# Patient Record
Sex: Male | Born: 1980 | Race: White | Hispanic: No | Marital: Married | Smoking: Former smoker
Health system: Southern US, Community
[De-identification: ages and names within clinical notes are randomized; demographics above are authoritative.]

## PROBLEM LIST (undated history)

## (undated) DIAGNOSIS — F419 Anxiety disorder, unspecified: Secondary | ICD-10-CM

## (undated) HISTORY — DX: Anxiety disorder, unspecified: F41.9

## (undated) HISTORY — PX: WISDOM TOOTH EXTRACTION: SHX21

---

## 2004-02-14 ENCOUNTER — Ambulatory Visit (HOSPITAL_COMMUNITY): Admission: RE | Admit: 2004-02-14 | Discharge: 2004-02-14 | Payer: Self-pay | Admitting: Specialist

## 2005-12-25 IMAGING — CR DG ORBITS FOR FOREIGN BODY
2 series · 2 of 2 positions shown · non-contrast
Comparison: none

CLINICAL DATA: Back pain, pre MRI, works with metal.  
 ORBITS FOR FOREIGN BODY:
 No evidence of a metallic foreign body.

[view not recorded (1 of 2)]
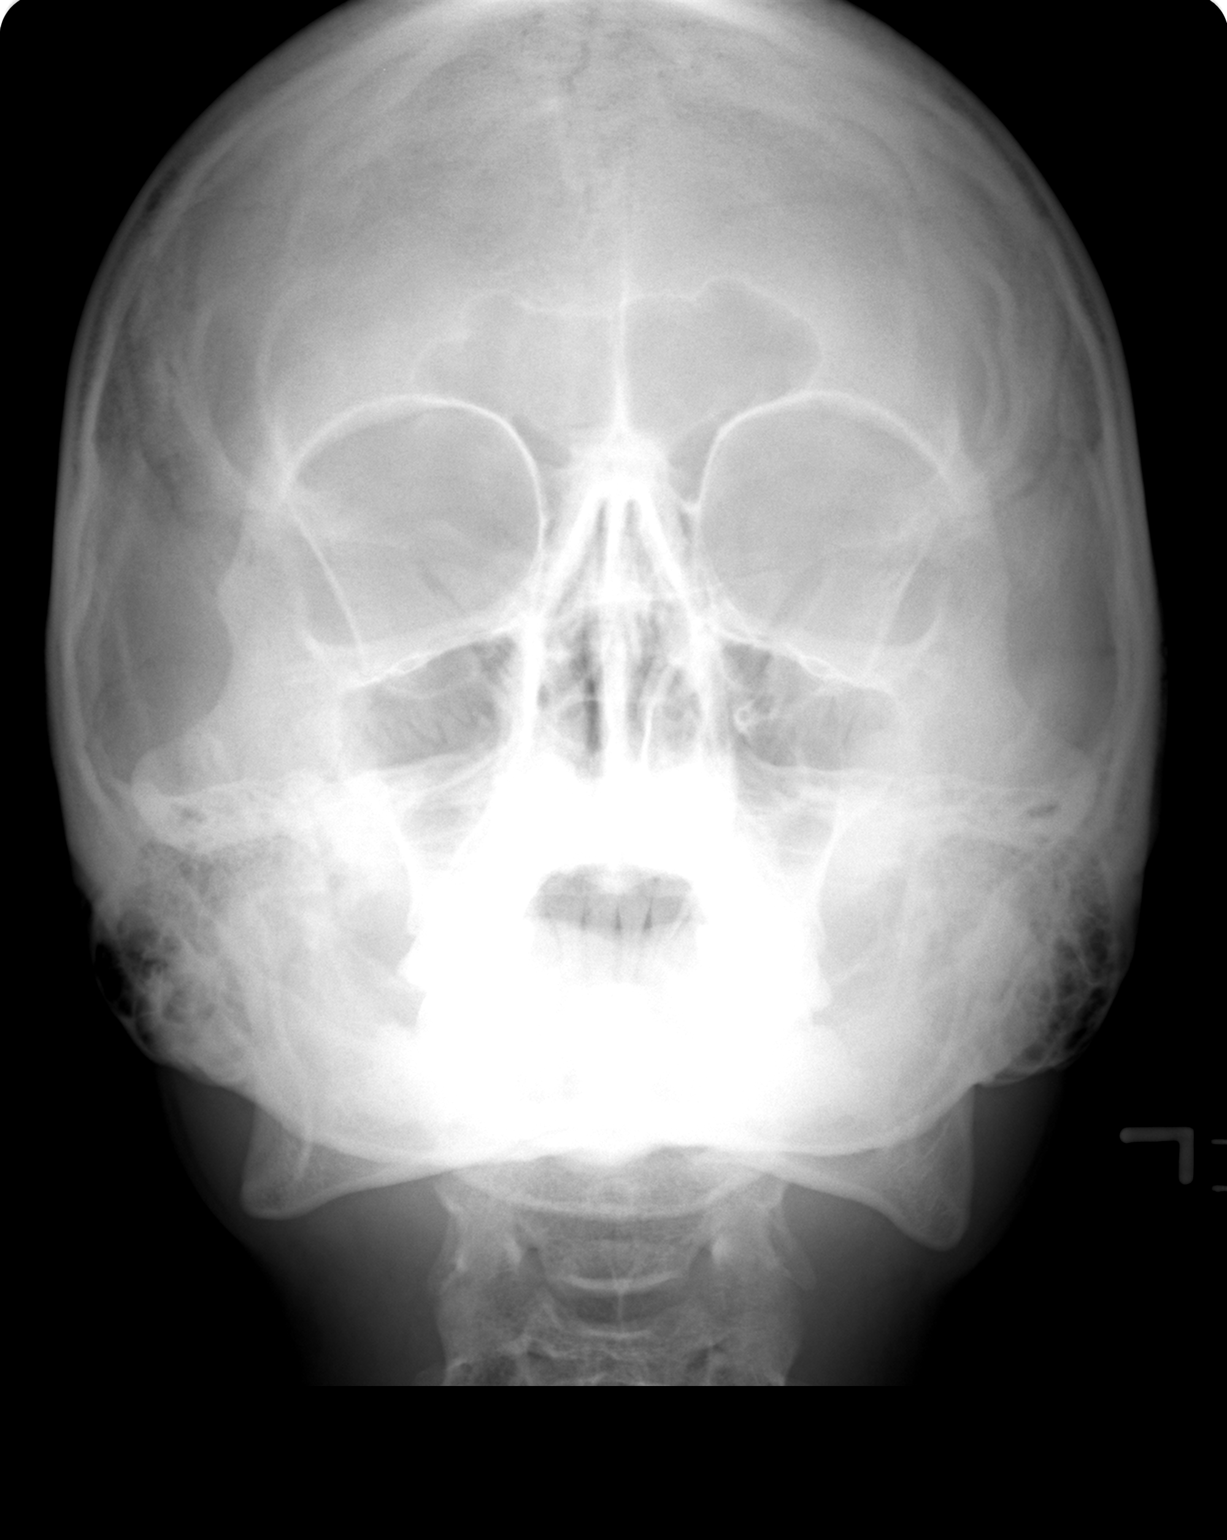

[view not recorded (2 of 2)]
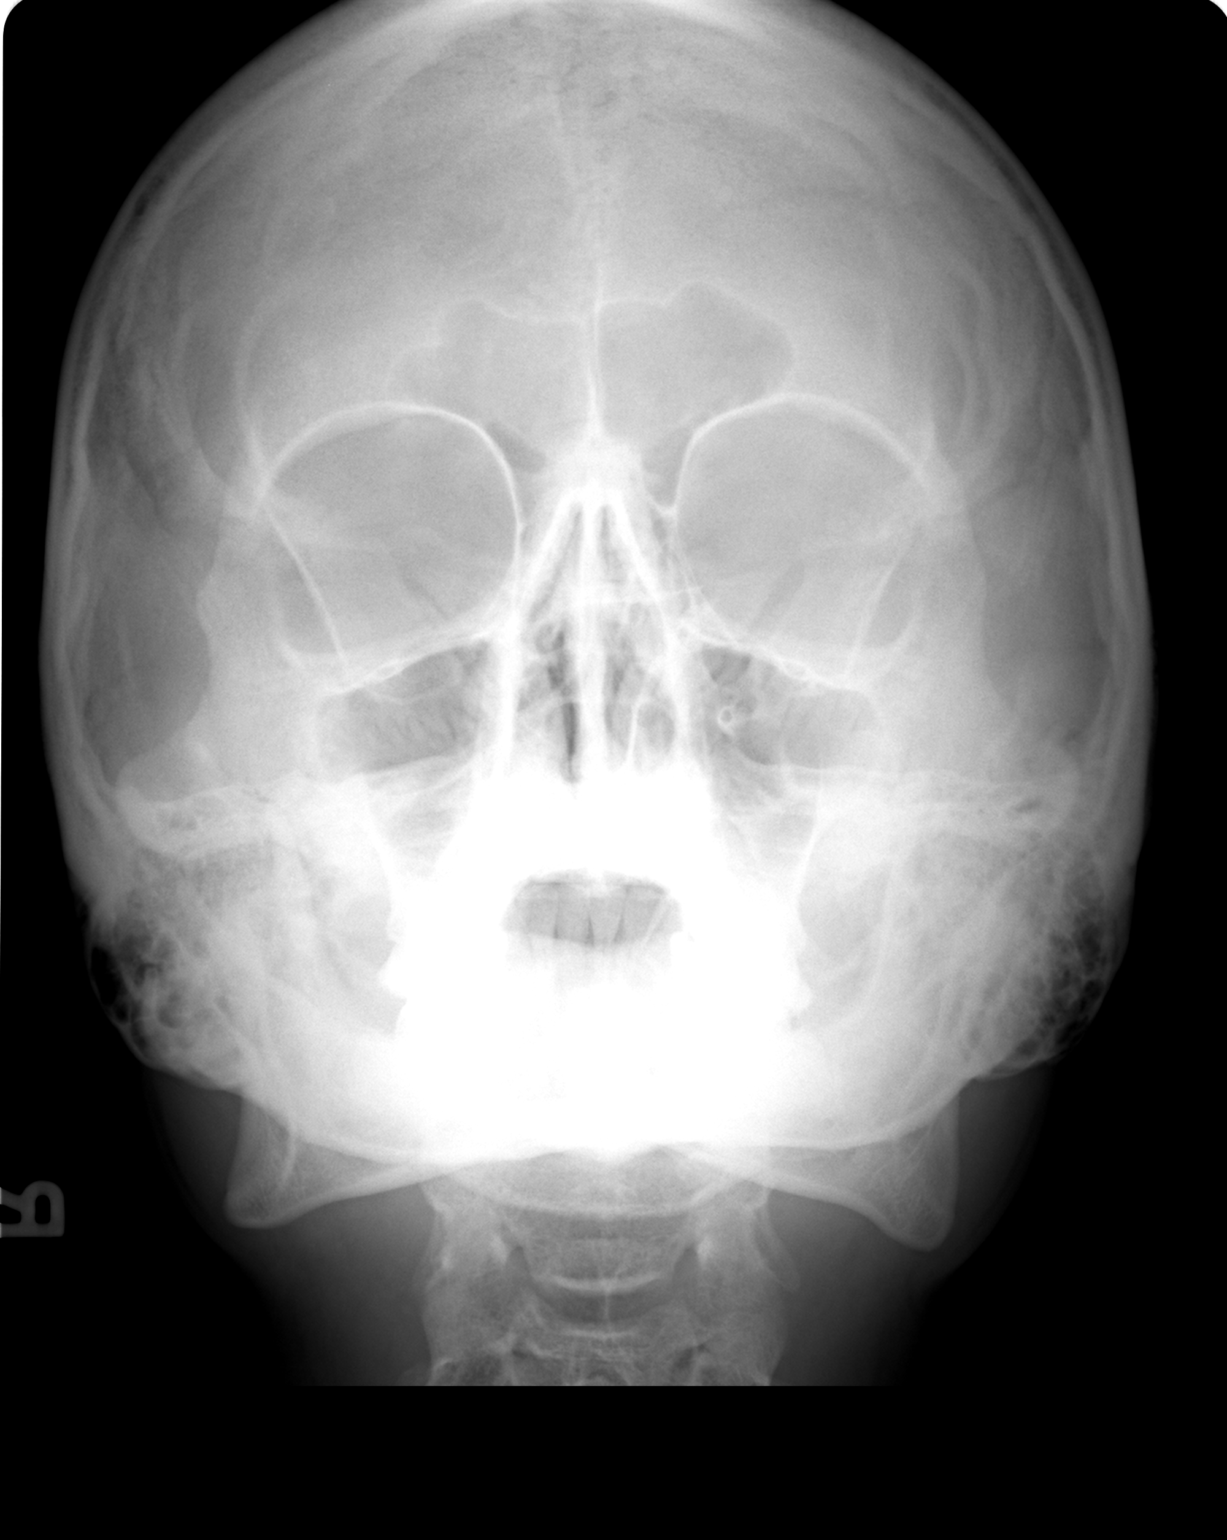

[2 of 2 positions shown; findings below may reference images not displayed]

IMPRESSION: No opaque foreign body.

## 2007-03-31 ENCOUNTER — Emergency Department (HOSPITAL_COMMUNITY): Admission: EM | Admit: 2007-03-31 | Discharge: 2007-03-31 | Payer: Self-pay | Admitting: Emergency Medicine

## 2010-09-26 ENCOUNTER — Encounter: Payer: Self-pay | Admitting: Internal Medicine

## 2010-09-26 ENCOUNTER — Other Ambulatory Visit: Payer: Self-pay | Admitting: Internal Medicine

## 2010-09-26 ENCOUNTER — Ambulatory Visit (INDEPENDENT_AMBULATORY_CARE_PROVIDER_SITE_OTHER): Payer: BC Managed Care – PPO | Admitting: Internal Medicine

## 2010-09-26 VITALS — BP 126/66 | HR 60 | Temp 98.8°F | Resp 14 | Wt 198.0 lb

## 2010-09-26 DIAGNOSIS — R202 Paresthesia of skin: Secondary | ICD-10-CM

## 2010-09-26 DIAGNOSIS — R209 Unspecified disturbances of skin sensation: Secondary | ICD-10-CM

## 2010-09-26 DIAGNOSIS — R6882 Decreased libido: Secondary | ICD-10-CM

## 2010-09-26 DIAGNOSIS — R5383 Other fatigue: Secondary | ICD-10-CM

## 2010-09-26 DIAGNOSIS — Z0000 Encounter for general adult medical examination without abnormal findings: Secondary | ICD-10-CM

## 2010-09-26 DIAGNOSIS — R5381 Other malaise: Secondary | ICD-10-CM

## 2010-09-26 DIAGNOSIS — Z23 Encounter for immunization: Secondary | ICD-10-CM

## 2010-09-26 MED ORDER — TETANUS-DIPHTH-ACELL PERTUSSIS 5-2.5-18.5 LF-MCG/0.5 IM SUSP
0.5000 mL | Freq: Once | INTRAMUSCULAR | Status: AC
  Administered 2010-09-26: 0.5 mL via INTRAMUSCULAR

## 2010-09-26 NOTE — Progress Notes (Signed)
Subjective:    Patient ID: James Orr, male    DOB: Aug 11, 1980, 30 y.o.   MRN: [id number]  HPI  James Orr)  is here for a physical; he has no acute issues except inguinal fungal infection for which he saw Dr Margo Aye, [facility]. He is concerned about his brother's multiple health issues.      Review of Systems Patient reports no significant  vision/ hearing changes,anorexia, weight change, fever ,adenopathy, persistant / recurrent hoarseness, swallowing issues, chest pain,palpitations, edema,persistant / recurrent cough, hemoptysis, dyspnea(rest, exertional, paroxysmal nocturnal), gastrointestinal  bleeding (melena, rectal bleeding), abdominal pain, excessive heart burn, GU symptoms( dysuria, hematuria, pyuria, voiding/incontinence  issues) syncope ( only remotely x2), focal weakness, memory loss, skin/hair/nail changes,depression, anxiety, abnormal bruising/bleeding, musculoskeletal symptoms/signs. He has persistent fatigue despite adequate rest,but he denies  muscle weakness. He has decreased libido; his brother had Testosterone deficiency as did MGF & M uncles.  Some anterior chest wall pain on R with turning in car. Intermittent numbness & tingling in forearms & hands with repeititive  hand use     Objective:   Physical Exam Gen.: Healthy and well-nourished in appearance. Alert, appropriate and cooperative throughout exam. Head: Normocephalic without obvious abnormalities;  no alopecia  Eyes: No corneal or conjunctival inflammation noted. Pupils equal round reactive to light and accommodation. Fundal exam is benign without hemorrhages, exudate, papilledema. Extraocular motion intact. Vision grossly normal. Ears: External  ear exam reveals no significant lesions or deformities. Canals clear .TMs normal. Hearing is grossly normal bilaterally. Nose: External nasal exam reveals no deformity or inflammation. Nasal mucosa are pink and moist. No lesions or exudates noted.Mouth: Oral mucosa and  oropharynx reveal no lesions or exudates. Teeth in good repair. Neck: No deformities, masses, or tenderness noted. Range of motion & . Thyroid normal. Lungs: Normal respiratory effort; chest expands symmetrically. Lungs are clear to auscultation without rales, wheezes, or increased work of breathing. Heart: Normal rate and rhythm. Normal S1 and S2. No gallop, click, or rub. No murmur. Abdomen: Bowel sounds normal; abdomen soft and nontender. No masses, organomegaly or hernias noted. Genitalia: normal. No testicular atrophy.DRE deferred due to age.   Small varicocele on L.                                                                                  Musculoskeletal/extremities: No deformity or scoliosis noted of  the thoracic or lumbar spine. No clubbing, cyanosis, edema, or deformity noted. Range of motion  normal .Tone & strength  normal.Joints normal. Nail health  good. Vascular: Carotid, radial artery, dorsalis pedis and dorsalis posterior tibial pulses are full and equal. No bruits present. Neurologic: Alert and oriented x3. Deep tendon reflexes symmetrical and normal. Tinel's negative.         Skin: Intact without suspicious lesions or rashes.Tatoo upper back Lymph: No cervical, axillary, or inguinal lymphadenopathy present. Psych: Mood and affect are normal. Normally interactive  Assessment & Plan:  #1 comprehensive physical exam; no acute findings #2 decreased libido in context of strong FH Testosterone deficiency #3 fatigue w/o frank muscle weakness Plan: see Orders. Note: IRBBB on EKG , lack of clinical significance discussed.

## 2010-09-26 NOTE — Patient Instructions (Signed)
Preventive Health Care: Exercise at least 30-45 minutes a day,  3-4 days a week.  Eat a low-fat diet with lots of fruits and vegetables, up to 7-9 servings per day. Consume less than 40 grams of sugar per day from foods & drinks with High Fructose Corn Sugar as #2,3 or # 4 on label. Safe sex - if you may be exposed to STDs, use a condom. Alcohol If you drink, do it moderately,less than 9 drinks per week, preferably less than 6 @ most. Depression is common in our stressful world.If you're feeling down or losing interest in things you normally enjoy, please call . Schedule fasting labs: BMET,Lipids, hepatic panel, CBC & dif, TSH, free T4, Testosterone level;Marland Kitchen See Diagnoses for Codes.

## 2010-09-27 ENCOUNTER — Other Ambulatory Visit (INDEPENDENT_AMBULATORY_CARE_PROVIDER_SITE_OTHER): Payer: BC Managed Care – PPO

## 2010-09-27 DIAGNOSIS — R5383 Other fatigue: Secondary | ICD-10-CM

## 2010-09-27 DIAGNOSIS — Z0000 Encounter for general adult medical examination without abnormal findings: Secondary | ICD-10-CM

## 2010-09-27 DIAGNOSIS — R5381 Other malaise: Secondary | ICD-10-CM

## 2010-09-27 DIAGNOSIS — R209 Unspecified disturbances of skin sensation: Secondary | ICD-10-CM

## 2010-09-27 DIAGNOSIS — R6882 Decreased libido: Secondary | ICD-10-CM

## 2010-09-27 LAB — BASIC METABOLIC PANEL
BUN: 15 mg/dL (ref 6–23)
CO2: 28 mEq/L (ref 19–32)
Calcium: 9.2 mg/dL (ref 8.4–10.5)
Chloride: 105 mEq/L (ref 96–112)
Creatinine, Ser: 1 mg/dL (ref 0.4–1.5)
GFR: 91.4 mL/min (ref >60.00)
Glucose, Bld: 88 mg/dL (ref 70–99)
Potassium: 4.7 mEq/L (ref 3.5–5.1)
Sodium: 139 mEq/L (ref 135–145)

## 2010-09-27 LAB — CBC WITH DIFFERENTIAL/PLATELET
Basophils Absolute: 0 10*3/uL (ref 0.0–0.1)
Basophils Relative: 0.4 % (ref 0.0–3.0)
Eosinophils Absolute: 0.2 10*3/uL (ref 0.0–0.7)
Eosinophils Relative: 3.6 % (ref 0.0–5.0)
HCT: 41.1 % (ref 39.0–52.0)
Hemoglobin: 14.3 g/dL (ref 13.0–17.0)
Lymphocytes Relative: 33.5 % (ref 12.0–46.0)
Lymphs Abs: 2.1 10*3/uL (ref 0.7–4.0)
MCHC: 34.6 g/dL (ref 30.0–36.0)
MCV: 96.1 fl (ref 78.0–100.0)
Monocytes Absolute: 0.6 10*3/uL (ref 0.1–1.0)
Monocytes Relative: 9.5 % (ref 3.0–12.0)
Neutro Abs: 3.3 10*3/uL (ref 1.4–7.7)
Neutrophils Relative %: 53 % (ref 43.0–77.0)
Platelets: 244 10*3/uL (ref 150.0–400.0)
RBC: 4.28 Mil/uL (ref 4.22–5.81)
RDW: 12.4 % (ref 11.5–14.6)
WBC: 6.3 10*3/uL (ref 4.5–10.5)

## 2010-09-27 LAB — HEPATIC FUNCTION PANEL
ALT: 29 U/L (ref 0–53)
AST: 22 U/L (ref 0–37)
Albumin: 4.5 g/dL (ref 3.5–5.2)
Alkaline Phosphatase: 52 U/L (ref 39–117)
Bilirubin, Direct: 0.1 mg/dL (ref 0.0–0.3)
Total Bilirubin: 0.6 mg/dL (ref 0.3–1.2)
Total Protein: 6.9 g/dL (ref 6.0–8.3)

## 2010-09-27 LAB — LIPID PANEL
Cholesterol: 255 mg/dL — ABNORMAL HIGH (ref 0–200)
HDL: 53.4 mg/dL (ref >39.00)
Total CHOL/HDL Ratio: 5
Triglycerides: 138 mg/dL (ref 0.0–149.0)
VLDL: 27.6 mg/dL (ref 0.0–40.0)

## 2010-09-27 LAB — TESTOSTERONE: Testosterone: 449.39 ng/dL (ref 350.00–890.00)

## 2010-09-27 LAB — LDL CHOLESTEROL, DIRECT: Direct LDL: 174.2 mg/dL

## 2010-09-27 LAB — TSH: TSH: 1.62 u[IU]/mL (ref 0.35–5.50)

## 2010-09-27 LAB — T4, FREE: Free T4: 0.97 ng/dL (ref 0.60–1.60)

## 2010-09-27 NOTE — Progress Notes (Signed)
Labs only

## 2011-02-21 ENCOUNTER — Ambulatory Visit (INDEPENDENT_AMBULATORY_CARE_PROVIDER_SITE_OTHER): Payer: Managed Care, Other (non HMO) | Admitting: Family Medicine

## 2011-02-21 ENCOUNTER — Encounter: Payer: Self-pay | Admitting: Family Medicine

## 2011-02-21 VITALS — BP 116/80 | HR 54 | Temp 98.5°F | Wt 203.6 lb

## 2011-02-21 DIAGNOSIS — J019 Acute sinusitis, unspecified: Secondary | ICD-10-CM

## 2011-02-21 DIAGNOSIS — J029 Acute pharyngitis, unspecified: Secondary | ICD-10-CM

## 2011-02-21 DIAGNOSIS — J329 Chronic sinusitis, unspecified: Secondary | ICD-10-CM

## 2011-02-21 LAB — POCT RAPID STREP A (OFFICE): Rapid Strep A Screen: NEGATIVE

## 2011-02-21 MED ORDER — AZELASTINE-FLUTICASONE 137-50 MCG/ACT NA SUSP: 1.0000 | Freq: Two times a day (BID) | NASAL | Status: DC

## 2011-02-21 MED ORDER — CEFUROXIME AXETIL 500 MG PO TABS: 500.0000 mg | ORAL_TABLET | Freq: Two times a day (BID) | ORAL | Status: AC

## 2011-02-21 NOTE — Patient Instructions (Signed)

## 2011-02-21 NOTE — Progress Notes (Signed)
  Subjective:     James Orr is a 30 y.o. male who presents for evaluation of sinus pain. Symptoms include: congestion, cough, facial pain, nasal congestion, post nasal drip, sinus pressure and sore throat. Onset of symptoms was 3 days ago. Symptoms have been gradually worsening since that time. Past history is significant for no history of pneumonia or bronchitis. Patient is a former smoker, quit 10 weeks ago.  The following portions of the patient's history were reviewed and updated as appropriate: allergies, current medications, past family history, past medical history, past social history, past surgical history and problem list.  Review of Systems Pertinent items are noted in HPI.   Objective:    BP 116/80  Pulse 54  Temp(Src) 98.5 F (36.9 C) (Oral)  Wt 203 lb 9.6 oz (92.352 kg)  SpO2 96% General appearance: alert, cooperative, appears stated age and no distress Ears: normal TM's and external ear canals both ears Nose: green discharge, moderate congestion, sinus tenderness bilateral Throat: lips, mucosa, and tongue normal; teeth and gums normal Neck: no adenopathy, supple, symmetrical, trachea midline and thyroid not enlarged, symmetric, no tenderness/mass/nodules Lungs: clear to auscultation bilaterally    Assessment:    Acute bacterial sinusitis.    Plan:    Nasal steroids per medication orders. Antihistamines per medication orders. Ceftin per medication orders.

## 2011-02-22 ENCOUNTER — Ambulatory Visit: Payer: BC Managed Care – PPO | Admitting: Internal Medicine

## 2011-03-15 ENCOUNTER — Encounter: Payer: Self-pay | Admitting: Internal Medicine

## 2011-03-15 ENCOUNTER — Ambulatory Visit (INDEPENDENT_AMBULATORY_CARE_PROVIDER_SITE_OTHER): Payer: Managed Care, Other (non HMO) | Admitting: Internal Medicine

## 2011-03-15 VITALS — BP 116/80 | HR 56 | Temp 98.4°F | Wt 203.6 lb

## 2011-03-15 DIAGNOSIS — L259 Unspecified contact dermatitis, unspecified cause: Secondary | ICD-10-CM

## 2011-03-15 DIAGNOSIS — L309 Dermatitis, unspecified: Secondary | ICD-10-CM

## 2011-03-15 DIAGNOSIS — J019 Acute sinusitis, unspecified: Secondary | ICD-10-CM

## 2011-03-15 DIAGNOSIS — R21 Rash and other nonspecific skin eruption: Secondary | ICD-10-CM | POA: Insufficient documentation

## 2011-03-15 MED ORDER — MOMETASONE FUROATE 0.1 % EX OINT: TOPICAL_OINTMENT | Freq: Two times a day (BID) | CUTANEOUS | Status: AC

## 2011-03-15 NOTE — Progress Notes (Signed)
  Subjective:    Patient ID: James Orr, male    DOB: June 21, 1980, 30 y.o.   MRN: [id number]  HPI RASH: Location: L knee  Onset: 9 days ago   Course: turned from white to red Self-treated with: Benadryl topically              Improvement with treatment: ? minimally History Pruritis: yes, better with Benadryl  Tenderness: no  New medications/antibiotics: completing Cefuroxime  Recent travel: yes, [address] ; he may have scraped his knee on the [place] [employer] Feeling ill: yes, only from sinuses  Fever: ?  Mouth lesions: no  Facial/tongue swelling/difficulty breathing:  no,     "Sinus" Onset/symptoms:see 11/14 OV Progression of symptoms: He took 3 days of cefuroxime and then went on vacation to the [place]. When he came back he restarted the cefuroxime. This did result in improvement in his sinus symptoms Present symptoms: Fever/chills/sweats:? fever Frontal headache:yes Facial pain:yes Nasal purulence:green & bloody discharge Sore throat:in ams Dental pain:no Lymphadenopathy:yes in Nov Cough/sputum/hemoptysis:green Associated extrinsic/allergic symptoms:itchy eyes/ sneezing:yes Past medical history: Seasonal allergies; some/asthma:no Smoking history:quit Sept 4           Review of Systems     Objective:   Physical Exam General appearance is of good health and nourishment; no acute distress or increased work of breathing is present.  No  lymphadenopathy about the head, neck, or axilla noted.   Eyes: No conjunctival inflammation or lid edema is present.  Ears:  External ear exam shows no significant lesions or deformities.  Otoscopic examination reveals clear canals, tympanic membranes are intact bilaterally without bulging, retraction, inflammation or discharge.  Nose:  External nasal examination shows no deformity or inflammation. Nasal mucosa are dry without lesions or exudates. No septal dislocation .No obstruction to airflow.   Oral exam: Dental hygiene is  good; lips and gums are healthy appearing.There is no oropharyngeal erythema or exudate noted.    Heart:  Normal rate and regular rhythm. S1 and S2 normal without gallop, murmur, click, rub or other extra sounds.   Lungs:Chest clear to auscultation; no wheezes, rhonchi,rales ,or rubs present.No increased work of breathing.    Extremities:  No cyanosis, edema, or clubbing  noted    Skin: Warm & dry ; there are 3 linear areas of papular  dermatitis over the left knee without evidence of cellulitis or significant inflammation         Assessment & Plan:  #1 dermatitis left knee; clinically a contact reaction is suggested. In the differential is Mycobacterium marinum, but the rash appears bland in nature. He has a past history of eczema as well as inguinal candidiasis and fungal toenail changes. He has seen Dr.Hall in the past for these problems. He'll be referred if there's no improvement with the prescribed medications.  #2 sinusitis with purulent and bloody discharge.  Plan: See orders and recommendations.

## 2011-03-15 NOTE — Patient Instructions (Signed)
Plain Mucinex for thick secretions ;force NON dairy fluids. Use a Neti pot daily as needed for sinus congestion. Dymista , 1 spray in each nostril twice a day as needed. Use the "crossover" technique as discussed

## 2011-06-06 ENCOUNTER — Encounter: Payer: Self-pay | Admitting: Internal Medicine

## 2011-06-06 ENCOUNTER — Ambulatory Visit (INDEPENDENT_AMBULATORY_CARE_PROVIDER_SITE_OTHER): Payer: Managed Care, Other (non HMO) | Admitting: Internal Medicine

## 2011-06-06 VITALS — BP 98/60 | HR 67 | Temp 98.5°F | Wt 202.0 lb

## 2011-06-06 DIAGNOSIS — H05819 Cyst of unspecified orbit: Secondary | ICD-10-CM

## 2011-06-06 DIAGNOSIS — H1031 Unspecified acute conjunctivitis, right eye: Secondary | ICD-10-CM

## 2011-06-06 DIAGNOSIS — H1030 Unspecified acute conjunctivitis, unspecified eye: Secondary | ICD-10-CM

## 2011-06-06 MED ORDER — ERYTHROMYCIN 5 MG/GM OP OINT: TOPICAL_OINTMENT | Freq: Four times a day (QID) | OPHTHALMIC | Status: AC

## 2011-06-06 NOTE — Progress Notes (Signed)
  Subjective:    Patient ID: James Orr, male    DOB: 06-Jun-1980, 31 y.o.   MRN: [id number]  HPI  3 days ago he began to have discomfort at the lateral corner of the right eye. He describes it as "slightly throbbing". There has been no purulence, fever, chills, sweats, blurred vision, double vision, or loss of vision. He is not treated this in any fashion. Today he noted a small "pimple" inside the lid.    Review of Systems He's had some greenish discharge from nares in mornings for several months; he denies frontal headaches, facial pain, dental pain, or sore throat.     Objective:   Physical Exam  General appearance:good health ;well nourished; no acute distress or increased work of breathing is present.  No  lymphadenopathy about the head, neck, or axilla noted.   Eyes: very minor  conjunctival inflammation  OD w/o  lid edema or purulence is present. There is no scleral icterus. Extraocular motion is intact; vision is normal reading wall chart. There is a 1-2 mm mucocele-appearing lesion at 6:00 in the OD lower lid  Ears:  External ear exam shows no significant lesions or deformities.  Otoscopic examination reveals clear canals, tympanic membranes are intact bilaterally without bulging, retraction, inflammation or discharge.  Nose:  External nasal examination shows no deformity or inflammation. Nasal mucosa are pink and moist without lesions or exudates. No septal dislocation or deviation.No obstruction to airflow.   Oral exam: Dental hygiene is good; lips and gums are healthy appearing.There is no oropharyngeal erythema or exudate noted.    Skin: Warm & dry w/o jaundice or tenting.            Assessment & Plan: #1  #1 #1 mild    #1 mild conjunctivitis; small mucocele suggested.  #2 some discolored nasal secretions in the morning; probable sequestered nasal secretions. No criteria for rhinosinusitis  Plan: See orders and recommendations

## 2011-06-06 NOTE — Patient Instructions (Signed)
After washing hands apply approximately 1 inch of the antibiotic ointment in the lower eye sac every 6 hours while awake. Wash hands again after applying the medication. 

## 2012-09-25 ENCOUNTER — Encounter: Payer: Self-pay | Admitting: Family Medicine

## 2012-09-25 ENCOUNTER — Ambulatory Visit (INDEPENDENT_AMBULATORY_CARE_PROVIDER_SITE_OTHER): Payer: 59 | Admitting: Family Medicine

## 2012-09-25 VITALS — BP 108/70 | HR 64 | Temp 98.2°F | Wt 208.2 lb

## 2012-09-25 DIAGNOSIS — L255 Unspecified contact dermatitis due to plants, except food: Secondary | ICD-10-CM

## 2012-09-25 DIAGNOSIS — L237 Allergic contact dermatitis due to plants, except food: Secondary | ICD-10-CM | POA: Insufficient documentation

## 2012-09-25 DIAGNOSIS — L0100 Impetigo, unspecified: Secondary | ICD-10-CM | POA: Insufficient documentation

## 2012-09-25 MED ORDER — CEPHALEXIN 500 MG PO CAPS: 500.0000 mg | ORAL_CAPSULE | Freq: Two times a day (BID) | ORAL | Status: AC

## 2012-09-25 MED ORDER — PREDNISONE 10 MG PO TABS: ORAL_TABLET | ORAL | Status: DC

## 2012-09-25 MED ORDER — METHYLPREDNISOLONE ACETATE 80 MG/ML IJ SUSP
80.0000 mg | Freq: Once | INTRAMUSCULAR | Status: AC
  Administered 2012-09-25: 80 mg via INTRAMUSCULAR

## 2012-09-25 MED ORDER — TRIAMCINOLONE ACETONIDE 0.1 % EX OINT: TOPICAL_OINTMENT | Freq: Two times a day (BID) | CUTANEOUS | Status: DC

## 2012-09-25 MED ORDER — HYDROXYZINE HCL 50 MG PO TABS: 50.0000 mg | ORAL_TABLET | Freq: Three times a day (TID) | ORAL | Status: DC | PRN

## 2012-09-25 NOTE — Progress Notes (Signed)
  Subjective:    Patient ID: KONNOR VONDRASEK, male    DOB: 27-Nov-1980, 32 y.o.   MRN: [id number]  HPI Rash- 1st appeared Monday.  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  When rash appeared developed Technu cream and 'blister popped and poison was running all over'.  L arm flexor crease now w/ crusted yellow material, cracked, dry skin, painful and itchy.  Surrounding redness.  No fevers.  Was in friend's yard on Saturday.   Review of Systems For ROS see HPI     Objective:   Physical Exam  Vitals reviewed. Constitutional: He appears well-developed and well-nourished. No distress.  Skin: Rash (scattered vesicular rash on abdomen, R forearm, and thighs) noted. There is erythema (L flexor crease of forearm w/ erythema and dry cracked skin surrounding yellow, crusted impetigo).          Assessment & Plan:

## 2012-09-25 NOTE — Patient Instructions (Addendum)
This started as a reaction to a plant (like poison ivy) but is now infected Start the Prednisone tomorrow AM- take w/ food (take all 3 at once) Start the Keflex tonight for the infection Use the Triamcinolone Ointment twice daily Take the Hydroxyzine at night for itch relief- may make you drowsy so avoid alcohol Call with any questions or concerns Hang in there!!

## 2012-09-26 NOTE — Assessment & Plan Note (Signed)
New.  Severe.  Steroid injxn given in office and pt to start oral prednisone in AM.  Triamcinolone topically prn.  Atarax for itching.  Reviewed supportive care and red flags that should prompt return.  Pt expressed understanding and is in agreement w/ plan.

## 2012-09-26 NOTE — Assessment & Plan Note (Signed)
New.  Start abx.  Reviewed supportive care and red flags that should prompt return.  Pt expressed understanding and is in agreement w/ plan.  

## 2012-12-03 ENCOUNTER — Encounter: Payer: Self-pay | Admitting: Family Medicine

## 2012-12-03 ENCOUNTER — Ambulatory Visit (INDEPENDENT_AMBULATORY_CARE_PROVIDER_SITE_OTHER): Payer: 59 | Admitting: Family Medicine

## 2012-12-03 VITALS — BP 100/68 | HR 61 | Temp 98.4°F | Wt 210.0 lb

## 2012-12-03 DIAGNOSIS — L659 Nonscarring hair loss, unspecified: Secondary | ICD-10-CM

## 2012-12-03 DIAGNOSIS — R21 Rash and other nonspecific skin eruption: Secondary | ICD-10-CM

## 2012-12-03 DIAGNOSIS — B360 Pityriasis versicolor: Secondary | ICD-10-CM

## 2012-12-03 MED ORDER — FINASTERIDE 1 MG PO TABS: 1.0000 mg | ORAL_TABLET | Freq: Every day | ORAL | Status: DC

## 2012-12-03 MED ORDER — METHYLPREDNISOLONE ACETATE 80 MG/ML IJ SUSP
80.0000 mg | Freq: Once | INTRAMUSCULAR | Status: AC
  Administered 2012-12-03: 80 mg via INTRAMUSCULAR

## 2012-12-03 MED ORDER — PREDNISONE 10 MG PO TABS: ORAL_TABLET | ORAL | Status: DC

## 2012-12-03 NOTE — Patient Instructions (Signed)
Tinea Versicolor  Tinea versicolor is a common yeast infection of the skin. This condition becomes known when the yeast on our skin starts to overgrow (yeast is a normal inhabitant on our skin). This condition is noticed as white or light brown patches on brown skin, and is more evident in the summer on tanned skin. These areas are slightly scaly if scratched. The light patches from the yeast become evident when the yeast creates "holes in your suntan". This is most often noticed in the summer. The patches are usually located on the chest, back, pubis, neck and body folds. However, it may occur on any area of body. Mild itching and inflammation (redness or soreness) may be present.  DIAGNOSIS   The diagnosisof this is made clinically (by looking). Cultures from samples are usually not needed. Examination under the microscope may help. However, yeast is normally found on skin. The diagnosis still remains clinical. Examination under Wood's Ultraviolet Light can determine the extent of the infection.  TREATMENT   This common infection is usually only of cosmetic (only a concern to your appearance). It is easily treated with dandruff shampoo used during showers or bathing. Vigorous scrubbing will eliminate the yeast over several days time. The light areas in your skin may remain for weeks or months after the infection is cured unless your skin is exposed to sunlight. The lighter or darker spots caused by the fungus that remain after complete treatment are not a sign of treatment failure; it will take a long time to resolve. Your caregiver may recommend a number of commercial preparations or medication by mouth if home care is not working. Recurrence is common and preventative medication may be necessary.  This skin condition is not highly contagious. Special care is not needed to protect close friends and family members. Normal hygiene is usually enough. Follow up is required only if you develop complications (such as a  secondary infection from scratching), if recommended by your caregiver, or if no relief is obtained from the preparations used.  Document Released: 03/23/2000 Document Revised: 06/18/2011 Document Reviewed: 05/05/2008  [employer] Patient Information 2014 [employer].

## 2012-12-03 NOTE — Progress Notes (Signed)
  Subjective:     James Orr is a 32 y.o. male who presents for evaluation of a rash involving the around wait. Rash started several days ago. Lesions are pink, and raised in texture. Rash has changed over time. Rash is pruritic. Associated symptoms: none. Patient denies: abdominal pain, arthralgia, congestion, cough, crankiness, decrease in appetite, decrease in energy level, fever, headache, irritability, myalgia, nausea, sore throat and vomiting. Patient has not had contacts with similar rash. Patient has not had new exposures (soaps, lotions, laundry detergents, foods, medications, plants, insects or animals).  The following portions of the patient's history were reviewed and updated as appropriate: allergies, current medications, past family history, past medical history, past social history, past surgical history and problem list.  Review of Systems Pertinent items are noted in HPI.    Objective:    BP 100/68  Pulse 61  Temp(Src) 98.4 F (36.9 C) (Oral)  Wt 210 lb (95.255 kg)  SpO2 98% General:  alert, cooperative and no distress  Skin:  papules noted on trunk around waist     Assessment:    dermatitis    Plan:    Medications: benadryl and steroids: depo medrol and steroid taper. verbal and written patient instruction given. Follow up in several days. -prn

## 2012-12-10 ENCOUNTER — Encounter: Payer: Self-pay | Admitting: Physician Assistant

## 2012-12-10 ENCOUNTER — Ambulatory Visit (INDEPENDENT_AMBULATORY_CARE_PROVIDER_SITE_OTHER): Payer: 59 | Admitting: Physician Assistant

## 2012-12-10 VITALS — BP 116/82 | HR 69 | Temp 98.2°F | Resp 16 | Wt 210.5 lb

## 2012-12-10 DIAGNOSIS — M79644 Pain in right finger(s): Secondary | ICD-10-CM | POA: Insufficient documentation

## 2012-12-10 DIAGNOSIS — M79609 Pain in unspecified limb: Secondary | ICD-10-CM

## 2012-12-10 NOTE — Assessment & Plan Note (Signed)
No history of trauma.  No evidence of vascular compromise, bony abnormality or snuffbox tenderness on examination.  Most likely pain is 2/2 over uses of R hand over the past few day's while jet-skiing, kayaking and bowling last night.  Patient encouraged to keep abrasion clean with soap and warm water, neosporin and bandage.  Ibuprofen or aleve as needed for pain.  Stiffness is usually worse 2 days after overuse.  Patient to call or return to clinic if symptoms persist or worsen.

## 2012-12-10 NOTE — Progress Notes (Signed)
Patient ID: James Orr, male   DOB: 06-17-1980, 32 y.o.   MRN: [id number]  Patient presents to clinic today c/o pain in the webbing between his R thumb and forefinger times 2 days.  Information was obtained from the patient.  Patient states that he noticed a cut in the webbing 2 days ago.  He had been kayaking and jet skiing that day.  States the area was slightly reddened and painful.  He endorses keeping the area clean.  Patient went bowling last night.  This am he noticed his R thumb hurts with motion and he is unable to grip things tightly secondary to pain.  Denies any trauma to R thumb or hand.  No pain elsewhere.  Denies fever, chills, night sweats.  Denies coolness of hands. Has taken some aleve with minimal relief of symptoms.  Past Medical History  Diagnosis Date  . Anxiety     counsellor seen X 2 years    Current Outpatient Prescriptions on File Prior to Visit  Medication Sig Dispense Refill  . finasteride (PROPECIA) 1 MG tablet Take 1 tablet (1 mg total) by mouth daily.  30 tablet  2  . predniSONE (DELTASONE) 10 MG tablet 3 po qd for 3 days then 2 po qd for 3 days the 1 po qd for 3 days  18 tablet  0   No current facility-administered medications on file prior to visit.   No Known Allergies  Family History  Problem Relation Age of Onset  . Hypertension Mother   . Hypertension Maternal Grandfather   . Hypertension Maternal Grandmother   . Hypertension Brother   . Hyperlipidemia Brother   . Hyperlipidemia Mother   . Hyperlipidemia Maternal Grandfather   . Hyperlipidemia Maternal Grandmother   . Diabetes Brother   . Arthritis Maternal Grandmother   . Depression Brother   . Alcohol abuse Maternal Grandfather   . Alcohol abuse Maternal Grandmother   . Lung cancer Maternal Grandfather   . Anxiety disorder Brother   . Anxiety disorder Mother   . Anxiety disorder Maternal Grandmother   . Colon polyps Mother   . Colon polyps Maternal Grandmother   . Osteoporosis Mother   .  Osteoporosis Maternal Grandmother   . COPD Maternal Grandmother   . Parkinsonism Father   . Gonadal disorder Brother     low Testosterone; also M uncles & MGF    History   Social History  . Marital Status: Single    Spouse Name: N/A    Number of Children: N/A  . Years of Education: N/A   Social History Main Topics  . Smoking status: Former Smoker -- 0.50 packs/day    Types: Cigarettes    Quit date: 12/04/2010  . Smokeless tobacco: None     Comment: Started 1996, quit x 7 years then restarted in 2007  . Alcohol Use: Yes     Comment: 20 drinks/ week  . Drug Use: Yes  . Sexual Activity: None   Other Topics Concern  . None   Social History Narrative  . None   Review of Systems  Constitutional: Negative for fever, chills, weight loss and malaise/fatigue.  Musculoskeletal: Positive for joint pain.  Skin: Negative for rash.  Endo/Heme/Allergies: Does not bruise/bleed easily.   Filed Vitals:   12/10/12 1322  BP: 116/82  Pulse: 69  Temp: 98.2 F (36.8 C)  Resp: 16    Physical Exam  Vitals reviewed. Constitutional: He is oriented to person, place, and time and well-developed,  well-nourished, and in no distress.  HENT:  Head: Normocephalic and atraumatic.  Cardiovascular: Intact distal pulses.   Musculoskeletal: Normal range of motion.  Patient has normal ROM of R thumb and fingers.  ROM of thumb induces pain.  No bony tenderness or abnormality noted.  No snuff box tenderness noted.  Neurological: He is alert and oriented to person, place, and time.  Skin: Skin is warm and dry.  1mm region of abrasion of webbing between R thumb and forefinger with some redness but no signs of drainage or streaking.   Assessment/Plan: Pain of right thumb No history of trauma.  No evidence of vascular compromise, bony abnormality or snuffbox tenderness on examination.  Most likely pain is 2/2 over uses of R hand over the past few day's while jet-skiing, kayaking and bowling last night.   Patient encouraged to keep abrasion clean with soap and warm water, neosporin and bandage.  Ibuprofen or aleve as needed for pain.  Stiffness is usually worse 2 days after overuse.  Patient to call or return to clinic if symptoms persist or worsen.

## 2012-12-10 NOTE — Patient Instructions (Signed)
Take ibuprofen as needed for pain.  This is most likely an overuse injury.  Pain is worse usually 2 days after incident.  For cut, continue cleaning the area and use topical neosporin cream.  If symptoms persist or worsen, please give [place] a call.

## 2012-12-30 ENCOUNTER — Encounter: Payer: Self-pay | Admitting: Internal Medicine

## 2012-12-30 ENCOUNTER — Ambulatory Visit (INDEPENDENT_AMBULATORY_CARE_PROVIDER_SITE_OTHER): Payer: 59 | Admitting: Internal Medicine

## 2012-12-30 VITALS — BP 119/72 | HR 70 | Temp 98.7°F | Wt 210.4 lb

## 2012-12-30 DIAGNOSIS — L739 Follicular disorder, unspecified: Secondary | ICD-10-CM

## 2012-12-30 DIAGNOSIS — L678 Other hair color and hair shaft abnormalities: Secondary | ICD-10-CM

## 2012-12-30 DIAGNOSIS — L738 Other specified follicular disorders: Secondary | ICD-10-CM

## 2012-12-30 MED ORDER — DOXYCYCLINE HYCLATE 100 MG PO TABS: 100.0000 mg | ORAL_TABLET | Freq: Two times a day (BID) | ORAL | Status: DC

## 2012-12-30 MED ORDER — HYDROXYZINE HCL 10 MG PO TABS: 10.0000 mg | ORAL_TABLET | Freq: Four times a day (QID) | ORAL | Status: DC | PRN

## 2012-12-30 NOTE — Progress Notes (Signed)
  Subjective:    Patient ID: James Orr, male    DOB: 05-30-1980, 32 y.o.   MRN: [id number]  HPI  He returns with a recurrence of the rash of the trunk chiefly around the waist. This was dramatically responsive to a weaning course of prednisone over 9 days; but recurred after he been off the prednisone to 36 hours.  The 12/03/12 office note was reviewed and he verified the data.  The rash had begun in late July without any specific trigger or vector.  There are only 2 of the papular lesions which are now pruritic.  No MRSA history or knowmn exposure.    Review of Systems  He still denies extrinsic symptoms of itchy, watery eyes, sneezing. He's had no oral lesions , swelling of his lips or tongue. He also did not have shortness of breath or wheezing.  He has no constitutional symptoms of fever, chills, sweats, weight loss.  He denies any symptoms of iritis or urethritis. He has no dysuria, pyuria, or hematuria.     Objective:   Physical Exam  Gen.: Healthy and well-nourished in appearance. Alert, appropriate and cooperative throughout exam. Eyes: No corneal or conjunctival inflammation noted.   Nose: External nasal exam reveals no deformity or inflammation. Nasal mucosa are pink and moist. No lesions or exudates noted.   Mouth: Oral mucosa and oropharynx reveal no lesions or exudates. Teeth in good repair. Neck: No deformities, masses, or tenderness noted.  Lungs: Normal respiratory effort; chest expands symmetrically. Lungs are clear to auscultation without rales, wheezes, or increased work of breathing. Heart: Normal rate and rhythm. Normal S1 and S2. No gallop, click, or rub.  Abdomen: Bowel sounds normal; abdomen soft and nontender. No masses, organomegaly or hernias noted.                              Musculoskeletal/extremities: No clubbing, cyanosis, edema, or significant extremity  deformity noted. Joints normal . Nail health good.       Skin: Noted anteriorly at the  waist and inguinal areas. These do definitely not blanch with pressure. There is no significant dermatographia noted Lymph: No cervical, axillary, or inguinal lymphadenopathy present. Psych: Mood and affect are normal. Normally interactive                                                                                        Assessment & Plan:  #1 localized papular dermatitis without evidence of cellulitis or pustules. Only 2 of lesions are pruritic in nature.  Plan: Doxycycline to treat possible  folliculitis. If the lesions do recur; possibly punch biopsy would be indicated.

## 2012-12-30 NOTE — Patient Instructions (Addendum)
Clean  the affected with Hibiclens daily  as we discussed. If this fails to control the folliculitis; one capful Clorox to be placed in a gallon of water and mixed well. This can be used to cleanse the skin once daily.

## 2013-12-21 ENCOUNTER — Ambulatory Visit (INDEPENDENT_AMBULATORY_CARE_PROVIDER_SITE_OTHER): Payer: 59 | Admitting: Internal Medicine

## 2013-12-21 ENCOUNTER — Encounter: Payer: Self-pay | Admitting: Internal Medicine

## 2013-12-21 VITALS — BP 110/80 | HR 54 | Temp 98.2°F | Wt 212.0 lb

## 2013-12-21 DIAGNOSIS — L2089 Other atopic dermatitis: Secondary | ICD-10-CM

## 2013-12-21 MED ORDER — MUPIROCIN 2 % EX OINT: TOPICAL_OINTMENT | CUTANEOUS | Status: DC

## 2013-12-21 NOTE — Patient Instructions (Addendum)
Fill the tub half full and add cap full of bleach. Stir the  water extremely well and then bathe; cleansing the  lesions well. Repeat this once a week x3. This is to eradicate recurrent skin infections.

## 2013-12-21 NOTE — Progress Notes (Signed)
Pre visit review using our clinic review tool, if applicable. No additional management support is needed unless otherwise documented below in the visit note. 

## 2013-12-21 NOTE — Progress Notes (Signed)
   Subjective:    Patient ID: James Orr, male    DOB: 1980-11-22, 33 y.o.   MRN: [id number]  HPI  Patient seen today for complaints of rash around abdomen.  Rash began in July of this year.  This is similar to the rash that he has had previously.  Last episode September 2014 at which time he was prescribed Doxycycline, Prednisone, and Atarax with resolution.    He denies new detergents, soaps, body wash.  Papular, pruritic rash across lower abdomen.    He has been using Benadryl with relief.  He is getting married in 5 weeks and desires definitive treatment.   He denies fevers, chills, unexplained weight change.    He has never been evaluated by dermatology.  Last physical with labs 2012.   Review of Systems  No HEENT symptoms.  No swelling of lips or tongue.  Denies chest pain, shortness of breath, wheezing.  Denies GU symptoms.     Objective:   Physical Exam        Assessment & Plan:

## 2013-12-21 NOTE — Progress Notes (Signed)
   Subjective:    Patient ID: James Orr, male    DOB: [birth year], 33 y.o.   MRN: [id number]  HPI   For the last 3 years from July to the Fall he will have papular lesions over the lateral inguinal areas which are pruritic. There's been no significant vector, chemical, or pet exposures.  He has no lesions of the genitalia.  These seemingly have responded to oral prednisone and doxycycline. He has also been using Hibiclens topically.  He has no history of MRSA. PMH of eczema as per Dr Suan Halter. One incident of impetigo as per Problem List      Review of Systems    He has no dysuria, pyuria, or hematuria. No associated itchy, watery eyes.  Swelling of the lips or tongue denied.  Shortness of breath, wheezing, or cough absent.  No urticaria noted.  Fever ,chills , or sweats denied. Purulence absent.  Diarrhea not present.      Objective:   Physical Exam   Pertinent positive findings include: He has scattered faint hyperpigmented scars over the lateral inguinal areas below the waistline. He also has a few scattered erythematous papules. These are described as pruritic. No pustules or vesicles are present. No dermatographia   General appearance :adequately nourished; in no distress. Eyes: No conjunctival inflammation or scleral icterus is present. Oral exam: Dental hygiene is good. Lips and gums are healthy appearing.There is no oropharyngeal erythema or exudate noted.  Heart:  Normal rate and regular rhythm. S1 and S2 normal without gallop, murmur, click, rub or other extra sounds   Lungs:Chest clear to auscultation; no wheezes, rhonchi,rales ,or rubs present.No increased work of breathing.  Abdomen: bowel sounds normal, soft and non-tender without masses, organomegaly or hernias noted.  No guarding or rebound.  Skin:Warm & dry.  No jaundice or tenting Lymphatic: No lymphadenopathy is noted about the head, neck, axilla, or inguinal areas.             Assessment  & Plan:  #1 papular dermatitis; R/O folliculitis See orders

## 2014-02-22 ENCOUNTER — Ambulatory Visit (INDEPENDENT_AMBULATORY_CARE_PROVIDER_SITE_OTHER): Payer: 59 | Admitting: Internal Medicine

## 2014-02-22 ENCOUNTER — Encounter: Payer: Self-pay | Admitting: Internal Medicine

## 2014-02-22 ENCOUNTER — Ambulatory Visit: Payer: 59 | Admitting: Internal Medicine

## 2014-02-22 VITALS — BP 100/62 | HR 65 | Temp 98.2°F | Resp 18 | Ht 74.0 in | Wt 215.0 lb

## 2014-02-22 DIAGNOSIS — T63441A Toxic effect of venom of bees, accidental (unintentional), initial encounter: Secondary | ICD-10-CM | POA: Insufficient documentation

## 2014-02-22 MED ORDER — EPINEPHRINE 0.3 MG/0.3ML IJ SOAJ: 0.3000 mg | Freq: Once | INTRAMUSCULAR | Status: DC

## 2014-02-22 MED ORDER — CETIRIZINE HCL 10 MG PO TABS: 10.0000 mg | ORAL_TABLET | Freq: Every day | ORAL | Status: DC

## 2014-02-22 MED ORDER — PREDNISONE 20 MG PO TABS: ORAL_TABLET | ORAL | Status: DC

## 2014-02-22 NOTE — Progress Notes (Signed)
Pre visit review using our clinic review tool, if applicable. No additional management support is needed unless otherwise documented below in the visit note. 

## 2014-02-22 NOTE — Progress Notes (Signed)
   Subjective:    Patient ID: James Orr, male    DOB: 01/25/1981, 33 y.o.   MRN: [id number]  HPI The patient is a 33 year old male who comes in today for an acute visit for bee sting. He states that he was stung by bees about 6 months ago had a little bit of swelling but was able to control with Benadryl. He was stung this week while he was blowing leaves on his porch. He was stung by about 5-6 bees about 6-7 times. He initially had mild swelling on his hand and elbow however now the swelling has progressed all the way up his arm to the level of the elbow on his right hand and his elbow on the left hand is swollen where he was bit as well. He denies any shortness of breath, facial swelling, facial rash, throat closing sensation. Since bee sting last he has not removed the nest from his property.  Review of Systems  Constitutional: Negative for fever, activity change, appetite change, fatigue and unexpected weight change.  Respiratory: Negative for cough, chest tightness, shortness of breath, wheezing and stridor.   Cardiovascular: Negative for chest pain, palpitations and leg swelling.  Musculoskeletal: Negative.   Skin: Positive for color change.       swelling  Neurological: Negative.       Objective:   Physical Exam  Constitutional: He appears well-developed and well-nourished.  HENT:  Head: Normocephalic and atraumatic.  Mouth/Throat: Oropharynx is clear and moist.  Eyes: EOM are normal.  Neck: Normal range of motion.  Cardiovascular: Normal rate and regular rhythm.   Pulmonary/Chest: Effort normal and breath sounds normal. No respiratory distress. He has no wheezes. He has no rales.  Abdominal: Soft. Bowel sounds are normal.  Musculoskeletal: He exhibits edema and tenderness.  Area of her right thumb with several sting marks. Entire hand swollen, swelling extends to the level of the elbow. Rash only on the thumb region. Left arm swelling along the medial malleolus of the elbow.  Also several sting marks as well as red rash. Swelling does not extend distally down the forearm or proximally up the upper arm.  Skin: Skin is warm and dry. There is erythema.  No swelling in the neck, face. No rash on the face. Oropharynx normal.   Filed Vitals:   02/22/14 1106  BP: 100/62  Pulse: 65  Temp: 98.2 F (36.8 C)  TempSrc: Oral  Resp: 18  Height: 6\' 2"  (1.88 m)  Weight: 215 lb (97.523 kg)  SpO2: 97%      Assessment & Plan:

## 2014-02-22 NOTE — Patient Instructions (Signed)
We will send in prednisone, which should reduce the swelling. Takes 3 pills by mouth today. Days 2 through 4 take 2 pills once daily, days 5 through 7 take 1 pill daily. Then stop.   We have also sent in Zyrtec. Take 1 pill (10 mg) by mouth daily for the next week to help with the histamine reaction. You can continue taking Benadryl as needed throughout the day.  For pain we recommend ibuprofen. He can take up to 3 pills (600 mg) by mouth up to 3 times daily for pain.  Usually swelling reactions from bee stings get the largest at 48 hours. If you continue to have increase in swelling we wish to see you back in the office. You can also use ice packs on the places where you have swelling to help the swelling decrease.  I would strongly recommend that you have someone else safely remove the bee nest from your property.  We have sent in an EpiPen device for you to your pharmacy. This is a device that if she should get stung by bees again you may need to administer to yourself to help decrease any swelling reaction. Please ask your pharmacist on instructions for how to use the device when he pick it up.  Bee, Wasp, or Hornet Sting Your caregiver has diagnosed you as having an insect sting. An insect sting appears as a red lump in the skin that sometimes has a tiny hole in the center, or it may have a stinger in the center of the wound. The most common stings are from wasps, hornets and bees. Individuals have different reactions to insect stings.  A normal reaction may cause pain, swelling, and redness around the sting site.  A localized allergic reaction may cause swelling and redness that extends beyond the sting site.  A large local reaction may continue to develop over the next 12 to 36 hours.  On occasion, the reactions can be severe (anaphylactic reaction). An anaphylactic reaction may cause wheezing; difficulty breathing; chest pain; fainting; raised, itchy, red patches on the skin; a sick  feeling to your stomach (nausea); vomiting; cramping; or diarrhea. If you have had an anaphylactic reaction to an insect sting in the past, you are more likely to have one again. HOME CARE INSTRUCTIONS   With bee stings, a small sac of poison is left in the wound. Brushing across this with something such as a credit card, or anything similar, will help remove this and decrease the amount of the reaction. This same procedure will not help a wasp sting as they do not leave behind a stinger and poison sac.  Apply a cold compress for 10 to 20 minutes every hour for 1 to 2 days, depending on severity, to reduce swelling and itching.  To lessen pain, a paste made of water and baking soda may be rubbed on the bite or sting and left on for 5 minutes.  To relieve itching and swelling, you may use take medication or apply medicated creams or lotions as directed.  Only take over-the-counter or prescription medicines for pain, discomfort, or fever as directed by your caregiver.  Wash the sting site daily with soap and water. Apply antibiotic ointment on the sting site as directed.  If you suffered a severe reaction:  If you did not require hospitalization, an adult will need to stay with you for 24 hours in case the symptoms return.  You may need to wear a medical bracelet or necklace stating  the allergy.  You and your family need to learn when and how to use an anaphylaxis kit or epinephrine injection.  If you have had a severe reaction before, always carry your anaphylaxis kit with you. SEEK MEDICAL CARE IF:   None of the above helps within 2 to 3 days.  The area becomes red, warm, tender, and swollen beyond the area of the bite or sting.  You have an oral temperature above 102 F (38.9 C). SEEK IMMEDIATE MEDICAL CARE IF:  You have symptoms of an allergic reaction which are:  Wheezing.  Difficulty breathing.  Chest pain.  Lightheadedness or fainting.  Itchy, raised, red patches on  the skin.  Nausea, vomiting, cramping or diarrhea. ANY OF THESE SYMPTOMS MAY REPRESENT A SERIOUS PROBLEM THAT IS AN EMERGENCY. Do not wait to see if the symptoms will go away. Get medical help right away. Call your local emergency services (911 in [place] DO NOT drive yourself to the hospital. MAKE SURE YOU:   Understand these instructions.  Will watch your condition.  Will get help right away if you are not doing well or get worse. Document Released: 03/26/2005 Document Revised: 06/18/2011 Document Reviewed: 09/10/2009 [facility] Patient Information 2015 [place], [place]. This information is not intended to replace advice given to you by your health care provider. Make sure you discuss any questions you have with your health care provider.

## 2014-02-22 NOTE — Assessment & Plan Note (Signed)
Patient was stung over the weekend and is approaching 48 hour mark. Likely swelling is at its maximal at this time. Will treat with prednisone quick taper given extensive swelling of his right extremity. If swelling is not reducing itself by Wednesday patient will come back to be seen again. Also prescription for Zyrtec 10 mg to be taken orally daily for the next week to help with histamine reaction. Advised ice pack on most swollen regions to help decrease swelling. Advised ibuprofen for pain if he has any. Advised that he certainly have someone remove the bee nest from his property so that this does not happen again. Given prescription for EpiPen and advised that if he is ever stung shoulders or above on his head he needs to administer EpiPen to himself.

## 2015-08-30 ENCOUNTER — Ambulatory Visit (INDEPENDENT_AMBULATORY_CARE_PROVIDER_SITE_OTHER): Payer: BLUE CROSS/BLUE SHIELD | Admitting: Internal Medicine

## 2015-08-30 ENCOUNTER — Encounter: Payer: Self-pay | Admitting: Internal Medicine

## 2015-08-30 VITALS — BP 102/78 | HR 59 | Wt 216.0 lb

## 2015-08-30 DIAGNOSIS — F4323 Adjustment disorder with mixed anxiety and depressed mood: Secondary | ICD-10-CM | POA: Diagnosis not present

## 2015-08-30 DIAGNOSIS — Z0000 Encounter for general adult medical examination without abnormal findings: Secondary | ICD-10-CM | POA: Diagnosis not present

## 2015-08-30 MED ORDER — SERTRALINE HCL 50 MG PO TABS: 50.0000 mg | ORAL_TABLET | Freq: Every day | ORAL | Status: DC

## 2015-08-30 MED ORDER — LORAZEPAM 0.5 MG PO TABS: 0.5000 mg | ORAL_TABLET | Freq: Two times a day (BID) | ORAL | Status: DC | PRN

## 2015-08-30 MED ORDER — VITAMIN D3 50 MCG (2000 UT) PO CAPS: 2000.0000 [IU] | ORAL_CAPSULE | Freq: Every day | ORAL | Status: DC

## 2015-08-30 NOTE — Assessment & Plan Note (Addendum)
  The pt would like to try Zoloft - will start a low dose  Potential benefits of a long term SSRI use as well as potential risks  and complications were explained to the patient and were aknowledged.  Lorazepam prn - use rarely  Potential benefits of a long term benzodiazepines  use as well as potential risks  and complications were explained to the patient and were aknowledged.  Psychology referral is pending Labs

## 2015-08-30 NOTE — Progress Notes (Signed)
Pre visit review using our clinic review tool, if applicable. No additional management support is needed unless otherwise documented below in the visit note. 
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
## 2015-08-31 ENCOUNTER — Other Ambulatory Visit (INDEPENDENT_AMBULATORY_CARE_PROVIDER_SITE_OTHER): Payer: BLUE CROSS/BLUE SHIELD

## 2015-08-31 DIAGNOSIS — Z0000 Encounter for general adult medical examination without abnormal findings: Secondary | ICD-10-CM

## 2015-08-31 DIAGNOSIS — F4323 Adjustment disorder with mixed anxiety and depressed mood: Secondary | ICD-10-CM

## 2015-08-31 LAB — BASIC METABOLIC PANEL
BUN: 19 mg/dL (ref 6–23)
CALCIUM: 9.4 mg/dL (ref 8.4–10.5)
CHLORIDE: 104 meq/L (ref 96–112)
CO2: 26 meq/L (ref 19–32)
CREATININE: 1.04 mg/dL (ref 0.40–1.50)
GFR: 85.78 mL/min (ref >60.00)
GLUCOSE: 94 mg/dL (ref 70–99)
Potassium: 4.3 mEq/L (ref 3.5–5.1)
Sodium: 137 mEq/L (ref 135–145)

## 2015-08-31 LAB — CBC WITH DIFFERENTIAL/PLATELET
BASOS ABS: 0 10*3/uL (ref 0.0–0.1)
Basophils Relative: 0.6 % (ref 0.0–3.0)
EOS PCT: 4.2 % (ref 0.0–5.0)
Eosinophils Absolute: 0.2 10*3/uL (ref 0.0–0.7)
HCT: 41.3 % (ref 39.0–52.0)
HEMOGLOBIN: 14.2 g/dL (ref 13.0–17.0)
LYMPHS ABS: 2.3 10*3/uL (ref 0.7–4.0)
LYMPHS PCT: 40.7 % (ref 12.0–46.0)
MCHC: 34.3 g/dL (ref 30.0–36.0)
MCV: 88.4 fl (ref 78.0–100.0)
MONOS PCT: 9.8 % (ref 3.0–12.0)
Monocytes Absolute: 0.6 10*3/uL (ref 0.1–1.0)
NEUTROS PCT: 44.7 % (ref 43.0–77.0)
Neutro Abs: 2.6 10*3/uL (ref 1.4–7.7)
Platelets: 277 10*3/uL (ref 150.0–400.0)
RBC: 4.67 Mil/uL (ref 4.22–5.81)
RDW: 12.5 % (ref 11.5–15.5)
WBC: 5.7 10*3/uL (ref 4.0–10.5)

## 2015-08-31 LAB — LIPID PANEL
CHOL/HDL RATIO: 6
Cholesterol: 226 mg/dL — ABNORMAL HIGH (ref 0–200)
HDL: 38.4 mg/dL — ABNORMAL LOW (ref >39.00)
LDL Cholesterol: 152 mg/dL — ABNORMAL HIGH (ref 0–99)
NONHDL: 187.51
TRIGLYCERIDES: 177 mg/dL — AB (ref 0.0–149.0)
VLDL: 35.4 mg/dL (ref 0.0–40.0)

## 2015-08-31 LAB — HEPATIC FUNCTION PANEL
ALBUMIN: 4.5 g/dL (ref 3.5–5.2)
ALK PHOS: 55 U/L (ref 39–117)
ALT: 31 U/L (ref 0–53)
AST: 23 U/L (ref 0–37)
BILIRUBIN DIRECT: 0.1 mg/dL (ref 0.0–0.3)
TOTAL PROTEIN: 7.2 g/dL (ref 6.0–8.3)
Total Bilirubin: 0.4 mg/dL (ref 0.2–1.2)

## 2015-08-31 LAB — VITAMIN B12: Vitamin B-12: 239 pg/mL (ref 211–911)

## 2015-08-31 LAB — VITAMIN D 25 HYDROXY (VIT D DEFICIENCY, FRACTURES): VITD: 16.28 ng/mL — ABNORMAL LOW (ref 30.00–100.00)

## 2015-08-31 LAB — TESTOSTERONE: Testosterone: 331.85 ng/dL (ref 300.00–890.00)

## 2015-08-31 LAB — TSH: TSH: 1.03 u[IU]/mL (ref 0.35–4.50)

## 2015-08-31 MED ORDER — VITAMIN B-12 1000 MCG SL SUBL: 1.0000 | SUBLINGUAL_TABLET | Freq: Every day | SUBLINGUAL | Status: DC

## 2015-08-31 MED ORDER — ERGOCALCIFEROL 1.25 MG (50000 UT) PO CAPS: 50000.0000 [IU] | ORAL_CAPSULE | ORAL | Status: DC

## 2015-09-27 ENCOUNTER — Telehealth: Payer: Self-pay | Admitting: Internal Medicine

## 2015-09-27 ENCOUNTER — Encounter: Payer: Self-pay | Admitting: Internal Medicine

## 2015-09-27 ENCOUNTER — Ambulatory Visit (INDEPENDENT_AMBULATORY_CARE_PROVIDER_SITE_OTHER): Payer: BLUE CROSS/BLUE SHIELD | Admitting: Internal Medicine

## 2015-09-27 VITALS — BP 98/70 | HR 60 | Wt 215.0 lb

## 2015-09-27 DIAGNOSIS — F4323 Adjustment disorder with mixed anxiety and depressed mood: Secondary | ICD-10-CM

## 2015-09-27 DIAGNOSIS — L309 Dermatitis, unspecified: Secondary | ICD-10-CM | POA: Diagnosis not present

## 2015-09-27 DIAGNOSIS — E559 Vitamin D deficiency, unspecified: Secondary | ICD-10-CM | POA: Insufficient documentation

## 2015-09-27 MED ORDER — TRIAMCINOLONE ACETONIDE 0.147 MG/GM EX AERS: INHALATION_SPRAY | Freq: Two times a day (BID) | CUTANEOUS | Status: DC

## 2015-09-27 MED ORDER — SERTRALINE HCL 100 MG PO TABS: 100.0000 mg | ORAL_TABLET | Freq: Every day | ORAL | Status: DC

## 2015-09-27 NOTE — Assessment & Plan Note (Signed)
6/17 increase Zoloft to 100 mg/d

## 2015-09-27 NOTE — Assessment & Plan Note (Signed)
Kenalog spray Athletic underware

## 2015-09-27 NOTE — Telephone Encounter (Signed)
OK. Thx
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
## 2015-09-27 NOTE — Progress Notes (Signed)
Pre visit review using our clinic review tool, if applicable. No additional management support is needed unless otherwise documented below in the visit note. 

## 2015-09-27 NOTE — Telephone Encounter (Signed)
[facility] is [facility] but likes [facility].  Would like to know if [facility] will take him on as a patient.

## 2015-09-28 NOTE — Telephone Encounter (Signed)
Got scheduled  °

## 2015-10-21 ENCOUNTER — Ambulatory Visit: Payer: Self-pay | Admitting: Internal Medicine

## 2015-11-28 ENCOUNTER — Ambulatory Visit: Payer: BLUE CROSS/BLUE SHIELD | Admitting: Internal Medicine

## 2015-11-29 ENCOUNTER — Ambulatory Visit: Payer: BLUE CROSS/BLUE SHIELD | Admitting: Internal Medicine

## 2015-12-16 ENCOUNTER — Encounter: Payer: Self-pay | Admitting: Internal Medicine

## 2015-12-16 ENCOUNTER — Ambulatory Visit (INDEPENDENT_AMBULATORY_CARE_PROVIDER_SITE_OTHER): Payer: BLUE CROSS/BLUE SHIELD | Admitting: Internal Medicine

## 2015-12-16 DIAGNOSIS — L309 Dermatitis, unspecified: Secondary | ICD-10-CM | POA: Diagnosis not present

## 2015-12-16 DIAGNOSIS — F4323 Adjustment disorder with mixed anxiety and depressed mood: Secondary | ICD-10-CM

## 2015-12-16 DIAGNOSIS — N522 Drug-induced erectile dysfunction: Secondary | ICD-10-CM | POA: Diagnosis not present

## 2015-12-16 DIAGNOSIS — Z23 Encounter for immunization: Secondary | ICD-10-CM

## 2015-12-16 MED ORDER — CLOTRIMAZOLE-BETAMETHASONE 1-0.05 % EX CREA: 1.0000 "application " | TOPICAL_CREAM | Freq: Two times a day (BID) | CUTANEOUS | 3 refills | Status: DC

## 2015-12-16 MED ORDER — VORTIOXETINE HBR 5 MG PO TABS
ORAL_TABLET | ORAL | 5 refills | Status: DC
Start: 2015-12-16 — End: 2016-01-09
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
## 2015-12-16 NOTE — Assessment & Plan Note (Addendum)
D/c Zoloft due to ED Start Trintellix  Viagra prn ED

## 2015-12-16 NOTE — Assessment & Plan Note (Addendum)
Dr Suan HalterJack Hall - had a bx Kenalog spray did not help Try Lotrisone - use bid

## 2015-12-16 NOTE — Progress Notes (Signed)
Pre visit review using our clinic review tool, if applicable. No additional management support is needed unless otherwise documented below in the visit note. 

## 2015-12-18 DIAGNOSIS — N529 Male erectile dysfunction, unspecified: Secondary | ICD-10-CM | POA: Insufficient documentation

## 2015-12-18 NOTE — Assessment & Plan Note (Signed)
9/17 due to Zoloft D/c Zoloft Try Viagra prn to restore confidence (samples)

## 2016-01-06 ENCOUNTER — Telehealth: Payer: Self-pay | Admitting: Internal Medicine

## 2016-01-06 NOTE — Telephone Encounter (Signed)
Pt called in said that the vortioxetine HBr (TRINTELLIX) 5 MG TABS [[id number]][[id number]] that Dr Macario GoldsPlot gave him on the 8th he never got filled.  He said that he has missed placed the script and can not find.  He wants to know if this can be reprinted and he can come pick up another one?

## 2016-01-09 MED ORDER — VORTIOXETINE HBR 5 MG PO TABS: 5.0000 mg | ORAL_TABLET | Freq: Every day | ORAL | 5 refills | Status: DC

## 2016-01-09 NOTE — Telephone Encounter (Signed)
Called pt ask if I can send rx to local pharmacy. [facility]. Sent electronically...Raechel Chute/lmb

## 2016-02-16 ENCOUNTER — Ambulatory Visit (INDEPENDENT_AMBULATORY_CARE_PROVIDER_SITE_OTHER): Payer: BLUE CROSS/BLUE SHIELD | Admitting: Internal Medicine

## 2016-02-16 ENCOUNTER — Encounter: Payer: Self-pay | Admitting: Internal Medicine

## 2016-02-16 DIAGNOSIS — F5102 Adjustment insomnia: Secondary | ICD-10-CM | POA: Diagnosis not present

## 2016-02-16 DIAGNOSIS — E559 Vitamin D deficiency, unspecified: Secondary | ICD-10-CM

## 2016-02-16 DIAGNOSIS — N522 Drug-induced erectile dysfunction: Secondary | ICD-10-CM | POA: Diagnosis not present

## 2016-02-16 DIAGNOSIS — G4700 Insomnia, unspecified: Secondary | ICD-10-CM | POA: Insufficient documentation

## 2016-02-16 DIAGNOSIS — F4323 Adjustment disorder with mixed anxiety and depressed mood: Secondary | ICD-10-CM | POA: Diagnosis not present

## 2016-02-16 MED ORDER — LORAZEPAM 0.5 MG PO TABS: 0.5000 mg | ORAL_TABLET | Freq: Two times a day (BID) | ORAL | 0 refills | Status: DC | PRN

## 2016-02-16 MED ORDER — TADALAFIL 20 MG PO TABS: 20.0000 mg | ORAL_TABLET | Freq: Every day | ORAL | 3 refills | Status: DC | PRN

## 2016-02-16 MED ORDER — VORTIOXETINE HBR 10 MG PO TABS: 10.0000 mg | ORAL_TABLET | Freq: Every day | ORAL | 5 refills | Status: DC

## 2016-02-16 NOTE — Assessment & Plan Note (Signed)
Trintellix - increase to 10 mg

## 2016-02-16 NOTE — Assessment & Plan Note (Signed)
Try Valerian root, Melatonin,  Tylenol PM for sleep
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
## 2016-02-16 NOTE — Patient Instructions (Signed)
Try Valerian root, Melatonin,  Tylenol PM for sleep 

## 2016-02-16 NOTE — Progress Notes (Signed)
Pre visit review using our clinic review tool, if applicable. No additional management support is needed unless otherwise documented below in the visit note. 

## 2016-02-16 NOTE — Assessment & Plan Note (Signed)
On Vit D 

## 2016-02-16 NOTE — Assessment & Plan Note (Signed)
Cialis prn 

## 2016-03-29 ENCOUNTER — Ambulatory Visit: Payer: BLUE CROSS/BLUE SHIELD | Admitting: Internal Medicine

## 2016-04-06 ENCOUNTER — Telehealth: Payer: Self-pay | Admitting: Internal Medicine

## 2016-04-06 NOTE — Telephone Encounter (Signed)
Patient states his HSA will not cover allergy medication unless he has a script.  Is needing a script stating that he needs zyrtec.  Patient will pick up script.

## 2016-04-10 MED ORDER — CETIRIZINE HCL 10 MG PO TABS: 10.0000 mg | ORAL_TABLET | Freq: Every day | ORAL | 3 refills | Status: DC

## 2016-04-10 NOTE — Telephone Encounter (Signed)
OK - Rx printed Thx

## 2016-04-11 NOTE — Telephone Encounter (Signed)
Patient advised that script is ready for pick up

## 2016-05-14 ENCOUNTER — Ambulatory Visit (INDEPENDENT_AMBULATORY_CARE_PROVIDER_SITE_OTHER): Payer: BLUE CROSS/BLUE SHIELD | Admitting: Internal Medicine

## 2016-05-14 ENCOUNTER — Other Ambulatory Visit: Payer: BLUE CROSS/BLUE SHIELD

## 2016-05-14 ENCOUNTER — Encounter: Payer: Self-pay | Admitting: Internal Medicine

## 2016-05-14 ENCOUNTER — Other Ambulatory Visit: Payer: Self-pay | Admitting: Internal Medicine

## 2016-05-14 VITALS — BP 118/72 | HR 66 | Temp 98.4°F | Resp 20 | Wt 218.5 lb

## 2016-05-14 DIAGNOSIS — Z23 Encounter for immunization: Secondary | ICD-10-CM

## 2016-05-14 DIAGNOSIS — Z111 Encounter for screening for respiratory tuberculosis: Secondary | ICD-10-CM

## 2016-05-14 DIAGNOSIS — Z0000 Encounter for general adult medical examination without abnormal findings: Secondary | ICD-10-CM | POA: Insufficient documentation

## 2016-05-14 DIAGNOSIS — N522 Drug-induced erectile dysfunction: Secondary | ICD-10-CM

## 2016-05-14 DIAGNOSIS — F4323 Adjustment disorder with mixed anxiety and depressed mood: Secondary | ICD-10-CM

## 2016-05-14 LAB — HEPATITIS B SURFACE ANTIBODY,QUALITATIVE: Hep B S Ab: POSITIVE — AB

## 2016-05-14 MED ORDER — LORAZEPAM 0.5 MG PO TABS: 0.5000 mg | ORAL_TABLET | Freq: Two times a day (BID) | ORAL | 0 refills | Status: DC | PRN

## 2016-05-14 MED ORDER — VORTIOXETINE HBR 10 MG PO TABS: 10.0000 mg | ORAL_TABLET | Freq: Every day | ORAL | 5 refills | Status: DC

## 2016-05-14 NOTE — Assessment & Plan Note (Signed)
Doing well on Trintellix Lorazepam - rare

## 2016-05-14 NOTE — Progress Notes (Signed)
Pre visit review using our clinic review tool, if applicable. No additional management support is needed unless otherwise documented below in the visit note. 
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
## 2016-05-14 NOTE — Assessment & Plan Note (Signed)
Cialis prn 

## 2016-05-14 NOTE — Assessment & Plan Note (Signed)
PPD Labs ordered, tDAP

## 2016-05-15 ENCOUNTER — Other Ambulatory Visit: Payer: BLUE CROSS/BLUE SHIELD

## 2016-05-15 LAB — VARICELLA ZOSTER ANTIBODY, IGG

## 2016-05-15 LAB — MEASLES/MUMPS/RUBELLA IMMUNITY
MUMPS IGG: 135 [AU]/ml — AB (ref <9.00)
RUBELLA: 4.58 {index} — AB (ref <0.90)
Rubeola IgG: >300 AU/mL — ABNORMAL HIGH (ref <25.00)

## 2016-05-17 ENCOUNTER — Encounter: Payer: Self-pay | Admitting: General Practice

## 2016-05-17 LAB — TB SKIN TEST
Induration: 0 mm
TB SKIN TEST: NEGATIVE

## 2016-05-21 ENCOUNTER — Telehealth: Payer: Self-pay | Admitting: Emergency Medicine

## 2016-05-21 ENCOUNTER — Encounter: Payer: Self-pay | Admitting: Internal Medicine

## 2016-05-21 LAB — PAIN MGMT, PROFILE 1 W/O CONF, U

## 2016-05-21 NOTE — Telephone Encounter (Signed)
Per cynthia/lab, this [facility] went to [facility] and was rec[facility] by [facility], however, they set the sample aside and then lost it---I have routed this back to dr plotnikov to see if he wants/needs patient to come back for retest---I will call patient back to advise after I get instructions from dr plotnikov

## 2016-05-21 NOTE — Telephone Encounter (Signed)
Pt called and wants to know if you can call him back about his drug screen results. Please advise thanks.

## 2016-07-06 ENCOUNTER — Other Ambulatory Visit: Payer: Self-pay | Admitting: Internal Medicine

## 2016-07-11 ENCOUNTER — Telehealth: Payer: Self-pay | Admitting: Internal Medicine

## 2016-07-11 ENCOUNTER — Other Ambulatory Visit: Payer: Self-pay

## 2016-07-11 MED ORDER — VORTIOXETINE HBR 10 MG PO TABS: 10.0000 mg | ORAL_TABLET | Freq: Every day | ORAL | 5 refills | Status: DC

## 2016-07-11 NOTE — Telephone Encounter (Signed)
Pt called in and said that he is needing a refill on his vortioxetine HBr (TRINTELLIX) 10 MG TABS [[id number]]

## 2016-07-11 NOTE — Telephone Encounter (Signed)
trintellix rx printed and signed

## 2016-07-11 NOTE — Progress Notes (Unsigned)
Ok to refill trintellix per dr Posey Rea, rx printed and signed

## 2016-07-11 NOTE — Telephone Encounter (Signed)
Faxed to [facility]

## 2016-07-12 ENCOUNTER — Other Ambulatory Visit: Payer: Self-pay | Admitting: Internal Medicine

## 2016-07-12 NOTE — Telephone Encounter (Signed)
Pt requesting for Rx...last seen 05/14/2016,  Last refill 05/14/2016....please advise

## 2016-07-16 ENCOUNTER — Other Ambulatory Visit: Payer: Self-pay

## 2016-07-16 MED ORDER — LORAZEPAM 0.5 MG PO TABS: 0.5000 mg | ORAL_TABLET | Freq: Two times a day (BID) | ORAL | 0 refills | Status: DC | PRN

## 2016-07-16 NOTE — Telephone Encounter (Signed)
Pharmacy wanting to know status of Lorazapem

## 2016-07-16 NOTE — Telephone Encounter (Signed)
Routing to dr plotnikov, please advise, thanks 

## 2016-07-16 NOTE — Telephone Encounter (Signed)
Pharmacy is also waiting on prior auth for trintellix.

## 2016-07-16 NOTE — Telephone Encounter (Signed)
Rec'd fax from [facility] for Lorazepam 0.5mg 

## 2016-07-17 ENCOUNTER — Telehealth: Payer: Self-pay | Admitting: Internal Medicine

## 2016-07-17 NOTE — Telephone Encounter (Signed)
Pt called stating that there has been an issue with getting the prescription for vortioxetine HBr (TRINTELLIX) 10 MG TABS. He said that the pharmacy told him that his insurance company is requiring a prior authorization. He also wanted to know if he could have all of his prescriptions sent over to the [facility]. Medications to be sent: Vortioxetine HBr (TRINTELLIX) 10 MG TABS, LORazepam (ATIVAN) 0.5 MG tablet and cetirizine (ZYRTEC) 10 MG tablet. Please advise. I told him that I would let him know the status of this later today.

## 2016-07-17 NOTE — Telephone Encounter (Signed)
RX called in .

## 2016-07-19 NOTE — Telephone Encounter (Signed)
Send RX to [facility] and spoke with pt, started PA, LM for pt to call to verify insurance.

## 2016-07-19 NOTE — Telephone Encounter (Signed)
PA started through [employer], Key: Ned Clines

## 2016-07-19 NOTE — Telephone Encounter (Signed)
Pt called again it is upset that no one has call him back. Can you call him back today .

## 2016-07-19 NOTE — Telephone Encounter (Signed)
Pt called back. See insurance info below: BC[employer] ID# [id number] [employer]# [id number] Subscriber Name: Verda Cumins Subscriber DOB: [birth year]

## 2016-07-20 NOTE — Telephone Encounter (Signed)
Routing to dr plotnikov, please advise, thanks 

## 2016-07-20 NOTE — Telephone Encounter (Signed)
Pt called in and wants  LORazepam (ATIVAN) 0.5 MG tablet and cetirizine (ZYRTEC) 10 MG tablet. Send to [facility] outpatient pharmacy

## 2016-07-21 NOTE — Telephone Encounter (Signed)
OK to fill this/these prescription(s) with additional refills x1  Thank you!  

## 2016-07-23 ENCOUNTER — Telehealth: Payer: Self-pay

## 2016-07-23 MED ORDER — CETIRIZINE HCL 10 MG PO TABS: 10.0000 mg | ORAL_TABLET | Freq: Every day | ORAL | 1 refills | Status: DC

## 2016-07-23 MED ORDER — LORAZEPAM 0.5 MG PO TABS: 0.5000 mg | ORAL_TABLET | Freq: Two times a day (BID) | ORAL | 1 refills | Status: DC | PRN

## 2016-07-23 MED FILL — LORazepam 0.5 MG TABS: 0.5 | 15 days supply | Qty: 60 | Fill #0

## 2016-07-23 NOTE — Telephone Encounter (Signed)
error 

## 2016-07-23 NOTE — Telephone Encounter (Signed)
Faxed to [place] [facility]

## 2016-07-30 NOTE — Telephone Encounter (Signed)
PA approved.

## 2016-09-04 MED FILL — LORazepam 0.5 MG TABS: 0.5 | 15 days supply | Qty: 60 | Fill #1

## 2016-09-11 ENCOUNTER — Encounter: Payer: Self-pay | Admitting: Internal Medicine

## 2016-09-11 ENCOUNTER — Ambulatory Visit (INDEPENDENT_AMBULATORY_CARE_PROVIDER_SITE_OTHER): Payer: BLUE CROSS/BLUE SHIELD | Admitting: Internal Medicine

## 2016-09-11 ENCOUNTER — Other Ambulatory Visit (INDEPENDENT_AMBULATORY_CARE_PROVIDER_SITE_OTHER): Payer: BLUE CROSS/BLUE SHIELD

## 2016-09-11 DIAGNOSIS — K120 Recurrent oral aphthae: Secondary | ICD-10-CM | POA: Insufficient documentation

## 2016-09-11 DIAGNOSIS — F4323 Adjustment disorder with mixed anxiety and depressed mood: Secondary | ICD-10-CM

## 2016-09-11 DIAGNOSIS — L309 Dermatitis, unspecified: Secondary | ICD-10-CM

## 2016-09-11 DIAGNOSIS — E559 Vitamin D deficiency, unspecified: Secondary | ICD-10-CM

## 2016-09-11 LAB — BASIC METABOLIC PANEL
BUN: 16 mg/dL (ref 6–23)
CHLORIDE: 105 meq/L (ref 96–112)
CO2: 29 mEq/L (ref 19–32)
CREATININE: 1.08 mg/dL (ref 0.40–1.50)
Calcium: 9.6 mg/dL (ref 8.4–10.5)
GFR: 81.66 mL/min (ref >60.00)
Glucose, Bld: 98 mg/dL (ref 70–99)
POTASSIUM: 4.6 meq/L (ref 3.5–5.1)
Sodium: 140 mEq/L (ref 135–145)

## 2016-09-11 MED ORDER — VALACYCLOVIR HCL 500 MG PO TABS: 500.0000 mg | ORAL_TABLET | Freq: Two times a day (BID) | ORAL | 3 refills | Status: DC

## 2016-09-11 MED ORDER — TRIAMCINOLONE ACETONIDE 0.147 MG/GM EX AERS: INHALATION_SPRAY | Freq: Two times a day (BID) | CUTANEOUS | 3 refills | Status: DC

## 2016-09-11 MED ORDER — VENLAFAXINE HCL ER 75 MG PO CP24: 75.0000 mg | ORAL_CAPSULE | Freq: Every day | ORAL | 5 refills | Status: DC

## 2016-09-11 MED FILL — VENLAFAXINE HCL ER 75 MG CA: 75 | 30 days supply | Qty: 30 | Fill #0

## 2016-09-11 MED FILL — VALACYCLOVIR HCL 500 MG TAB: 500 | 7 days supply | Qty: 14 | Fill #0

## 2016-09-11 NOTE — Patient Instructions (Signed)
Gluten free trial (no wheat products) for 4-6 weeks. OK to use gluten-free bread and gluten-free pasta.     

## 2016-09-11 NOTE — Assessment & Plan Note (Signed)
Valtrex prn  

## 2016-09-11 NOTE — Assessment & Plan Note (Signed)
Celiac panel Gluten free trial (no wheat products) for 4-6 weeks. OK to use gluten-free bread and gluten-free pasta.

## 2016-09-11 NOTE — Assessment & Plan Note (Signed)
Vit D 
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
## 2016-09-20 ENCOUNTER — Encounter: Payer: Self-pay | Admitting: Internal Medicine

## 2016-09-21 MED ORDER — TADALAFIL 20 MG PO TABS: 20.0000 mg | ORAL_TABLET | Freq: Every day | ORAL | 2 refills | Status: DC | PRN

## 2016-09-24 ENCOUNTER — Encounter: Payer: Self-pay | Admitting: Internal Medicine

## 2016-09-24 DIAGNOSIS — R21 Rash and other nonspecific skin eruption: Secondary | ICD-10-CM

## 2016-09-24 MED ORDER — TADALAFIL 20 MG PO TABS: 20.0000 mg | ORAL_TABLET | Freq: Every day | ORAL | 5 refills | Status: DC | PRN

## 2016-10-02 ENCOUNTER — Telehealth: Payer: Self-pay

## 2016-10-02 NOTE — Telephone Encounter (Signed)
PA for cialis has been initiated.    KEY: [id number]

## 2016-10-18 NOTE — Telephone Encounter (Signed)
PA approved from 10/03/16-10/03/17

## 2016-10-22 ENCOUNTER — Encounter: Payer: Self-pay | Admitting: Internal Medicine

## 2016-10-26 ENCOUNTER — Other Ambulatory Visit: Payer: Self-pay | Admitting: Internal Medicine

## 2016-10-26 MED ORDER — VENLAFAXINE HCL ER 150 MG PO CP24: 150.0000 mg | ORAL_CAPSULE | Freq: Every day | ORAL | 1 refills | Status: DC

## 2016-11-01 ENCOUNTER — Encounter: Payer: Self-pay | Admitting: Internal Medicine

## 2016-11-01 MED ORDER — VENLAFAXINE HCL ER 150 MG PO CP24: 150.0000 mg | ORAL_CAPSULE | Freq: Every day | ORAL | 1 refills | Status: DC

## 2016-11-01 MED FILL — VENLAFAXINE HCL ER 75 MG CA: 75 | 30 days supply | Qty: 30 | Fill #1

## 2016-11-26 MED FILL — VENLAFAXINE HCL ER 75 MG CA: 75 | 30 days supply | Qty: 30 | Fill #2

## 2016-11-26 MED FILL — SELENIUM SULF 2.5% SHAMPOO: 2.5 | 7 days supply | Qty: 118 | Fill #0

## 2016-12-13 ENCOUNTER — Ambulatory Visit: Payer: BLUE CROSS/BLUE SHIELD | Admitting: Internal Medicine

## 2016-12-24 ENCOUNTER — Encounter: Payer: Self-pay | Admitting: Internal Medicine

## 2016-12-27 ENCOUNTER — Encounter: Payer: Self-pay | Admitting: Internal Medicine

## 2016-12-28 ENCOUNTER — Encounter: Payer: Self-pay | Admitting: Internal Medicine

## 2016-12-30 ENCOUNTER — Telehealth: Payer: Self-pay | Admitting: Internal Medicine

## 2016-12-30 MED ORDER — VENLAFAXINE HCL ER 75 MG PO CP24
75.0000 mg | ORAL_CAPSULE | Freq: Every day | ORAL | 5 refills | Status: DC
Start: 2016-12-30 — End: 2017-11-08

## 2016-12-30 NOTE — Telephone Encounter (Signed)
Needs a letter Kathie Rhodes, Pls print a letter from 9/23 and mail to [employer]

## 2016-12-31 NOTE — Telephone Encounter (Signed)
Printed letter per MD and mail to address on file.Marland KitchenRaechel Chute

## 2017-09-09 MED FILL — VENLAFAXINE HCL ER 75 MG CA: 75 | 30 days supply | Qty: 30 | Fill #3

## 2017-10-08 ENCOUNTER — Other Ambulatory Visit: Payer: Self-pay | Admitting: *Deleted

## 2017-10-08 MED ORDER — TADALAFIL 20 MG PO TABS: 20.0000 mg | ORAL_TABLET | Freq: Every day | ORAL | 5 refills | Status: DC | PRN

## 2017-10-08 NOTE — Telephone Encounter (Signed)
Signed rx faxed to [email or web address]. See meds.

## 2017-11-08 ENCOUNTER — Encounter: Payer: Self-pay | Admitting: Internal Medicine

## 2017-11-08 ENCOUNTER — Ambulatory Visit (INDEPENDENT_AMBULATORY_CARE_PROVIDER_SITE_OTHER): Payer: 59 | Admitting: Internal Medicine

## 2017-11-08 DIAGNOSIS — L237 Allergic contact dermatitis due to plants, except food: Secondary | ICD-10-CM | POA: Diagnosis not present

## 2017-11-08 DIAGNOSIS — F4323 Adjustment disorder with mixed anxiety and depressed mood: Secondary | ICD-10-CM | POA: Diagnosis not present

## 2017-11-08 MED ORDER — CLOBETASOL PROPIONATE 0.05 % EX CREA: 1.0000 "application " | TOPICAL_CREAM | Freq: Two times a day (BID) | CUTANEOUS | 1 refills | Status: DC

## 2017-11-08 MED ORDER — PREDNISONE 10 MG PO TABS: ORAL_TABLET | ORAL | 1 refills | Status: DC

## 2017-11-08 MED ORDER — VENLAFAXINE HCL ER 75 MG PO CP24: 75.0000 mg | ORAL_CAPSULE | Freq: Every day | ORAL | 5 refills | Status: DC

## 2017-11-08 MED FILL — predniSONE 10 MG TABS: 10 | 17 days supply | Qty: 38 | Fill #0

## 2017-11-08 MED FILL — VENLAFAXINE HCL ER 75 MG CA: 75 | 30 days supply | Qty: 30 | Fill #0

## 2017-11-08 MED FILL — CLOBETASOL PROPIONATE 0.05: 0.05 | 5 days supply | Qty: 15 | Fill #0

## 2017-11-08 NOTE — Assessment & Plan Note (Signed)
Prednisone 10 mg: take 4 tabs a day x 3 days; then 3 tabs a day x 4 days; then 2 tabs a day x 4 days, then 1 tab a day x 6 days, then stop. Take pc. Clobetasol cream
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
## 2017-11-08 NOTE — Assessment & Plan Note (Signed)
Effexor XR 

## 2018-02-28 ENCOUNTER — Ambulatory Visit: Payer: 59 | Admitting: Internal Medicine

## 2018-02-28 DIAGNOSIS — Z0289 Encounter for other administrative examinations: Secondary | ICD-10-CM

## 2019-02-15 DIAGNOSIS — Z7184 Encounter for health counseling related to travel: Secondary | ICD-10-CM

## 2019-03-31 ENCOUNTER — Other Ambulatory Visit: Payer: 59

## 2019-06-12 ENCOUNTER — Other Ambulatory Visit: Payer: Self-pay

## 2019-06-12 MED ORDER — TADALAFIL 20 MG PO TABS: 20.0000 mg | ORAL_TABLET | Freq: Every day | ORAL | 5 refills | Status: DC | PRN

## 2019-07-22 ENCOUNTER — Other Ambulatory Visit: Payer: 59

## 2019-07-27 ENCOUNTER — Ambulatory Visit: Payer: 59 | Attending: Internal Medicine

## 2019-07-27 DIAGNOSIS — Z20822 Contact with and (suspected) exposure to covid-19: Secondary | ICD-10-CM

## 2019-07-28 LAB — NOVEL CORONAVIRUS, NAA: SARS-CoV-2, NAA: NOT DETECTED

## 2019-07-28 LAB — SARS-COV-2, NAA 2 DAY TAT

## 2020-10-24 ENCOUNTER — Telehealth: Payer: Self-pay | Admitting: *Deleted

## 2020-10-24 NOTE — Telephone Encounter (Signed)
-----   Message from Don Broach sent at 10/24/2020  2:03 PM EDT ----- Refill request

## 2020-10-24 NOTE — Telephone Encounter (Signed)
Refill was denied pt is overdue for appt. Faxed back stating Denied. Pt need OV for renewal../lmb

## 2020-11-30 ENCOUNTER — Telehealth: Payer: Self-pay | Admitting: Internal Medicine

## 2020-11-30 NOTE — Telephone Encounter (Signed)
Refill was denied pt was told he need ov before med can be renewed. Last saw MD back in 2019.Marland KitchenRaechel Chute

## 2020-12-16 ENCOUNTER — Telehealth: Payer: Self-pay

## 2020-12-16 NOTE — Telephone Encounter (Signed)
**  Please advise as the pt has asked and requested a med refill for his tadalafil (CIALIS) 20 MG tablet .   Pt was informed that he needs to see Dr. Posey Rea as he has not been seen in office for 3 years and needs to be seen for any further refills.

## 2021-01-06 ENCOUNTER — Encounter: Payer: Self-pay | Admitting: Internal Medicine

## 2021-01-06 ENCOUNTER — Ambulatory Visit (INDEPENDENT_AMBULATORY_CARE_PROVIDER_SITE_OTHER): Payer: 59 | Admitting: Internal Medicine

## 2021-01-06 ENCOUNTER — Other Ambulatory Visit: Payer: Self-pay

## 2021-01-06 ENCOUNTER — Other Ambulatory Visit (HOSPITAL_COMMUNITY): Payer: Self-pay

## 2021-01-06 VITALS — BP 120/78 | HR 87 | Temp 98.4°F | Ht 74.0 in | Wt 219.4 lb

## 2021-01-06 DIAGNOSIS — Z0000 Encounter for general adult medical examination without abnormal findings: Secondary | ICD-10-CM

## 2021-01-06 DIAGNOSIS — T63441A Toxic effect of venom of bees, accidental (unintentional), initial encounter: Secondary | ICD-10-CM | POA: Diagnosis not present

## 2021-01-06 DIAGNOSIS — N522 Drug-induced erectile dysfunction: Secondary | ICD-10-CM

## 2021-01-06 DIAGNOSIS — Z0001 Encounter for general adult medical examination with abnormal findings: Secondary | ICD-10-CM | POA: Diagnosis not present

## 2021-01-06 DIAGNOSIS — R21 Rash and other nonspecific skin eruption: Secondary | ICD-10-CM

## 2021-01-06 DIAGNOSIS — F4323 Adjustment disorder with mixed anxiety and depressed mood: Secondary | ICD-10-CM

## 2021-01-06 DIAGNOSIS — K625 Hemorrhage of anus and rectum: Secondary | ICD-10-CM

## 2021-01-06 LAB — COMPREHENSIVE METABOLIC PANEL
ALT: 31 U/L (ref 0–53)
AST: 20 U/L (ref 0–37)
Albumin: 4.5 g/dL (ref 3.5–5.2)
Alkaline Phosphatase: 54 U/L (ref 39–117)
BUN: 16 mg/dL (ref 6–23)
CO2: 27 mEq/L (ref 19–32)
Calcium: 9.6 mg/dL (ref 8.4–10.5)
Chloride: 101 mEq/L (ref 96–112)
Creatinine, Ser: 1.05 mg/dL (ref 0.40–1.50)
GFR: 88.13 mL/min (ref >60.00)
Glucose, Bld: 108 mg/dL — ABNORMAL HIGH (ref 70–99)
Potassium: 4.5 mEq/L (ref 3.5–5.1)
Sodium: 136 mEq/L (ref 135–145)
Total Bilirubin: 0.4 mg/dL (ref 0.2–1.2)
Total Protein: 7.6 g/dL (ref 6.0–8.3)

## 2021-01-06 LAB — URINALYSIS
Bilirubin Urine: NEGATIVE
Hgb urine dipstick: NEGATIVE
Ketones, ur: NEGATIVE
Leukocytes,Ua: NEGATIVE
Nitrite: NEGATIVE
Specific Gravity, Urine: 1.02 (ref 1.000–1.030)
Total Protein, Urine: NEGATIVE
Urine Glucose: NEGATIVE
Urobilinogen, UA: 0.2 (ref 0.0–1.0)
pH: 6 (ref 5.0–8.0)

## 2021-01-06 LAB — CBC WITH DIFFERENTIAL/PLATELET
Basophils Absolute: 0 10*3/uL (ref 0.0–0.1)
Basophils Relative: 0.5 % (ref 0.0–3.0)
Eosinophils Absolute: 0.7 10*3/uL (ref 0.0–0.7)
Eosinophils Relative: 10.7 % — ABNORMAL HIGH (ref 0.0–5.0)
HCT: 42.7 % (ref 39.0–52.0)
Hemoglobin: 14.4 g/dL (ref 13.0–17.0)
Lymphocytes Relative: 31.4 % (ref 12.0–46.0)
Lymphs Abs: 2 10*3/uL (ref 0.7–4.0)
MCHC: 33.8 g/dL (ref 30.0–36.0)
MCV: 90.6 fl (ref 78.0–100.0)
Monocytes Absolute: 0.5 10*3/uL (ref 0.1–1.0)
Monocytes Relative: 7.7 % (ref 3.0–12.0)
Neutro Abs: 3.2 10*3/uL (ref 1.4–7.7)
Neutrophils Relative %: 49.7 % (ref 43.0–77.0)
Platelets: 252 10*3/uL (ref 150.0–400.0)
RBC: 4.71 Mil/uL (ref 4.22–5.81)
RDW: 12.4 % (ref 11.5–15.5)
WBC: 6.4 10*3/uL (ref 4.0–10.5)

## 2021-01-06 LAB — LIPID PANEL
Cholesterol: 243 mg/dL — ABNORMAL HIGH (ref 0–200)
HDL: 49.6 mg/dL (ref >39.00)
NonHDL: 193.14
Total CHOL/HDL Ratio: 5
Triglycerides: 221 mg/dL — ABNORMAL HIGH (ref 0.0–149.0)
VLDL: 44.2 mg/dL — ABNORMAL HIGH (ref 0.0–40.0)

## 2021-01-06 LAB — LDL CHOLESTEROL, DIRECT: Direct LDL: 159 mg/dL

## 2021-01-06 LAB — TSH: TSH: 1.36 u[IU]/mL (ref 0.35–5.50)

## 2021-01-06 MED ORDER — TADALAFIL 20 MG PO TABS: 20.0000 mg | ORAL_TABLET | Freq: Every day | ORAL | 5 refills | Status: DC | PRN

## 2021-01-06 MED ORDER — EPINEPHRINE 0.3 MG/0.3ML IJ SOAJ: 0.3000 mg | Freq: Once | INTRAMUSCULAR | 1 refills | Status: AC

## 2021-01-06 MED ORDER — PERMETHRIN 5 % EX CREA
1.0000 "application " | TOPICAL_CREAM | Freq: Once | CUTANEOUS | 1 refills | Status: AC
  Filled 2021-01-06: qty 60, 14d supply, fill #0

## 2021-01-06 NOTE — Assessment & Plan Note (Signed)
Continue with Cialis 20 mg as needed

## 2021-01-06 NOTE — Assessment & Plan Note (Signed)
Worse. James Orr's close friend has committed suicide recently.  He has been very upset and sad.  He has a history of depression in the past.  Psychology consultation/referral was advised to address grieving.
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
## 2021-01-06 NOTE — Assessment & Plan Note (Signed)
EpiPen prescription was renewed.

## 2021-01-06 NOTE — Assessment & Plan Note (Signed)
New.  Probably hemorrhoids versus other.  GI referral.  Stool softener

## 2021-01-06 NOTE — Patient Instructions (Signed)
  Gluten free trial for 4-6 weeks. OK to use gluten-free bread and gluten-free pasta.    Gluten-Free Diet for Celiac Disease, Adult The gluten-free diet includes all foods that do not contain gluten. Gluten is a protein that is found in wheat, rye, barley, and some other grains. Following the gluten-free diet is the only treatment for people with celiac disease. It helps to prevent damage to the intestines and improves or eliminates the symptoms of celiac disease. Following the gluten-free diet requires some planning. It can be challenging at first, but it gets easier with time and practice. There are more gluten-free options available today than ever before. If you need help finding gluten-free foods or if you have questions, talk with your diet and nutrition specialist (registered dietitian) or your health care provider. What do I need to know about a gluten-free diet?  All fruits, vegetables, and meats are safe to eat and do not contain gluten.  When grocery shopping, start by shopping in the produce, meat, and dairy sections. These sections are more likely to contain gluten-free foods. Then move to the aisles that contain packaged foods if you need to.  Read all food labels. Gluten is often added to foods. Always check the ingredient list and look for warnings, such as "may contain gluten."  Talk with your dietitian or health care provider before taking a gluten-free multivitamin or mineral supplement.  Be aware of gluten-free foods having contact with foods that contain gluten (cross-contamination). This can happen at home and with any processed foods. ? Talk with your health care provider or dietitian about how to reduce the risk of cross-contamination in your home. ? If you have questions about how a food is processed, ask the manufacturer. What key words help to identify gluten? Foods that list any of these key words on the label usually contain gluten:  Wheat, flour, enriched  flour, bromated flour, white flour, durum flour, graham flour, phosphated flour, self-rising flour, semolina, farina, barley (malt), rye, and oats.  Starch, dextrin, modified food starch, or cereal.  Thickening, fillers, or emulsifiers.  Malt flavoring, malt extract, or malt syrup.  Hydrolyzed vegetable protein.  In the [place] packaged foods that are gluten-free are required to be labeled "GF." These foods should be easy to identify and are safe to eat. In the [place] food companies are also required to list common food allergens, including wheat, on their labels. Recommended foods Grains  Amaranth, bean flours, 100% buckwheat flour, corn, millet, nut flours or nut meals, GF oats, quinoa, rice, sorghum, teff, rice wafers, pure cornmeal tortillas, popcorn, and hot cereals made from cornmeal. Hominy, rice, wild rice. Some Asian rice noodles or bean noodles. Arrowroot starch, corn bran, corn flour, corn germ, cornmeal, corn starch, potato flour, potato starch flour, and rice bran. Plain, brown, and sweet rice flours. Rice polish, soy flour, and tapioca starch. Vegetables  All plain fresh, frozen, and canned vegetables. Fruits  All plain fresh, frozen, canned, and dried fruits, and 100% fruit juices. Meats and other protein foods  All fresh beef, pork, poultry, fish, seafood, and eggs. Fish canned in water, oil, brine, or vegetable broth. Plain nuts and seeds, peanut butter. Some lunch meat and some frankfurters. Dried beans, dried peas, and lentils. Dairy  Fresh plain, dry, evaporated, or condensed milk. Cream, butter, sour cream, whipping cream, and most yogurts. Unprocessed cheese, most processed cheeses, some cottage cheese, some cream cheeses. Beverages  Coffee, tea, most herbal teas. Carbonated beverages and some root beers.   Wine, sake, and distilled spirits, such as gin, vodka, and whiskey. Most hard ciders. Fats and oils  Butter, margarine, vegetable oil, hydrogenated butter, olive  oil, shortening, lard, cream, and some mayonnaise. Some commercial salad dressings. Olives. Sweets and desserts  Sugar, honey, some syrups, molasses, jelly, and jam. Plain hard candy, marshmallows, and gumdrops. Pure cocoa powder. Plain chocolate. Custard and some pudding mixes. Gelatin desserts, sorbets, frozen ice pops, and sherbet. Cake, cookies, and other desserts prepared with allowed flours. Some commercial ice creams. Cornstarch, tapioca, and rice puddings. Seasoning and other foods  Some canned or frozen soups. Monosodium glutamate (MSG). Cider, rice, and wine vinegar. Baking soda and baking powder. Cream of tartar. Baking and nutritional yeast. Certain soy sauces made without wheat (ask your dietitian about specific brands that are allowed). Nuts, coconut, and chocolate. Salt, pepper, herbs, spices, flavoring extracts, imitation or artificial flavorings, natural flavorings, and food colorings. Some medicines and supplements. Some lip glosses and other cosmetics. Rice syrups. The items listed may not be a complete list. Talk with your dietitian about what dietary choices are best for you. Foods to avoid Grains  Barley, bran, bulgur, couscous, cracked wheat, [place], farro, graham, malt, matzo, semolina, wheat germ, and all wheat and rye cereals including spelt and kamut. Cereals containing malt as a flavoring, such as rice cereal. Noodles, spaghetti, macaroni, most packaged rice mixes, and all mixes containing wheat, rye, barley, or triticale. Vegetables  Most creamed vegetables and most vegetables canned in sauces. Some commercially prepared vegetables and salads. Fruits  Thickened or prepared fruits and some pie fillings. Some fruit snacks and fruit roll-ups. Meats and other protein foods  Any meat or meat alternative containing wheat, rye, barley, or gluten stabilizers. These are often marinated or packaged meats and lunch meats. Bread-containing products, such as Swiss steak,  croquettes, meatballs, and meatloaf. Most tuna canned in vegetable broth and turkey with hydrolyzed vegetable protein (HVP) injected as part of the basting. Seitan. Imitation fish. Eggs in sauces made from ingredients to avoid. Dairy  Commercial chocolate milk drinks and malted milk. Some non-dairy creamers. Any cheese product containing ingredients to avoid. Beverages  Certain cereal beverages. Beer, ale, malted milk, and some root beers. Some hard ciders. Some instant flavored coffees. Some herbal teas made with barley or with barley malt added. Fats and oils  Some commercial salad dressings. Sour cream containing modified food starch. Sweets and desserts  Some toffees. Chocolate-coated nuts (may be rolled in wheat flour) and some commercial candies and candy bars. Most cakes, cookies, donuts, pastries, and other baked goods. Some commercial ice cream. Ice cream cones. Commercially prepared mixes for cakes, cookies, and other desserts. Bread pudding and other puddings thickened with flour. Products containing brown rice syrup made with barley malt enzyme. Desserts and sweets made with malt flavoring. Seasoning and other foods  Some curry powders, some dry seasoning mixes, some gravy extracts, some meat sauces, some ketchups, some prepared mustards, and horseradish. Certain soy sauces. Malt vinegar. Bouillon and bouillon cubes that contain HVP. Some chip dips, and some chewing gum. Yeast extract. Brewer's yeast. Caramel color. Some medicines and supplements. Some lip glosses and other cosmetics. The items listed may not be a complete list. Talk with your dietitian about what dietary choices are best for you. Summary  Gluten is a protein that is found in wheat, rye, barley, and some other grains. The gluten-free diet includes all foods that do not contain gluten.  If you need help finding gluten-free foods or if   you have questions, talk with your diet and nutrition specialist (registered  dietitian) or your health care provider.  Read all food labels. Gluten is often added to foods. Always check the ingredient list and look for warnings, such as "may contain gluten." This information is not intended to replace advice given to you by your health care provider. Make sure you discuss any questions you have with your health care provider. Document Released: 03/26/2005 Document Revised: 01/09/2016 Document Reviewed: 01/09/2016 Elsevier Interactive Patient Education  2018 Elsevier Inc.   

## 2021-01-06 NOTE — Assessment & Plan Note (Signed)
Rash is located on lower abdomen x 12 years, worse in the summer months.  He saw a dermatologist.  Biopsy was done.  He received treatment for fungal infection and for scabies in the past.  Of note he goes to [facility] often and spends a lot of time in/on the water. I wonder if could be related to scabies or be allergic to water mites/parasites. Permethrin cream was prescribed empirically

## 2021-01-11 ENCOUNTER — Other Ambulatory Visit: Payer: Self-pay | Admitting: Internal Medicine

## 2021-01-11 ENCOUNTER — Other Ambulatory Visit (HOSPITAL_COMMUNITY): Payer: Self-pay

## 2021-01-11 MED ORDER — LORAZEPAM 0.5 MG PO TABS
0.5000 mg | ORAL_TABLET | Freq: Two times a day (BID) | ORAL | 1 refills | Status: DC | PRN
  Filled 2021-01-11: qty 60, 15d supply, fill #0
  Filled 2021-03-08: qty 60, 15d supply, fill #1

## 2021-01-16 ENCOUNTER — Other Ambulatory Visit (HOSPITAL_COMMUNITY): Payer: Self-pay

## 2021-01-17 ENCOUNTER — Other Ambulatory Visit (HOSPITAL_COMMUNITY): Payer: Self-pay

## 2021-02-22 ENCOUNTER — Other Ambulatory Visit: Payer: Self-pay | Admitting: Internal Medicine

## 2021-02-22 MED ORDER — PERMETHRIN 5 % EX CREA
1.0000 "application " | TOPICAL_CREAM | Freq: Once | CUTANEOUS | 1 refills | Status: AC
  Filled 2021-02-22: qty 60, 1d supply, fill #0

## 2021-02-23 ENCOUNTER — Other Ambulatory Visit (HOSPITAL_COMMUNITY): Payer: Self-pay

## 2021-03-03 ENCOUNTER — Other Ambulatory Visit (HOSPITAL_COMMUNITY): Payer: Self-pay

## 2021-03-08 ENCOUNTER — Other Ambulatory Visit (HOSPITAL_COMMUNITY): Payer: Self-pay

## 2021-04-02 ENCOUNTER — Encounter: Payer: Self-pay | Admitting: Internal Medicine

## 2021-04-03 ENCOUNTER — Telehealth: Payer: 59 | Admitting: Nurse Practitioner

## 2021-04-03 ENCOUNTER — Encounter: Payer: Self-pay | Admitting: Nurse Practitioner

## 2021-04-03 DIAGNOSIS — J069 Acute upper respiratory infection, unspecified: Secondary | ICD-10-CM

## 2021-04-03 DIAGNOSIS — J111 Influenza due to unidentified influenza virus with other respiratory manifestations: Secondary | ICD-10-CM

## 2021-04-03 MED ORDER — CARESTART COVID-19 HOME TEST VI KIT
PACK | 1 refills | Status: DC
  Filled 2021-04-03: qty 2, 2d supply, fill #0

## 2021-04-03 NOTE — Progress Notes (Signed)
Virtual Visit Consent   James Orr, you are scheduled for a virtual visit with a [facility] provider today.     Just as with appointments in the office, your consent must be obtained to participate.  Your consent will be active for this visit and any virtual visit you may have with one of our providers in the next 365 days.     If you have a MyChart account, a copy of this consent can be sent to you electronically.  All virtual visits are billed to your insurance company just like a traditional visit in the office.    As this is a virtual visit, video technology does not allow for your provider to perform a traditional examination.  This may limit your provider's ability to fully assess your condition.  If your provider identifies any concerns that need to be evaluated in person or the need to arrange testing (such as labs, EKG, etc.), we will make arrangements to do so.     Although advances in technology are sophisticated, we cannot ensure that it will always work on either your end or our end.  If the connection with a video visit is poor, the visit may have to be switched to a telephone visit.  With either a video or telephone visit, we are not always able to ensure that we have a secure connection.     I need to obtain your verbal consent now.   Are you willing to proceed with your visit today?    James Orr has provided verbal consent on 04/03/2021 for a virtual visit (video or telephone).   Apolonio Schneiders, FNP   Date: 04/03/2021 3:22 PM   Virtual Visit via Video Note   I, Apolonio Schneiders, connected with  James Orr  ([id number], 25-May-1980) on 04/03/21 at  3:30 PM EST by a video-enabled telemedicine application and verified that I am speaking with the correct person using two identifiers.  Location: Patient: Virtual Visit Location Patient: Home Provider: Virtual Visit Location Provider: Home Office   I discussed the limitations of evaluation and management by telemedicine and  the availability of in person appointments. The patient expressed understanding and agreed to proceed.    History of Present Illness: James Orr is a 40 y.o. who identifies as a male who was assigned male at birth, and is being seen today   With complaints of a sore throat that started yesterday and also has a productive cough that was worse in the morning.   Denies a fever  Left eye has been red and uncomfortable today  He has body aches as well   He has been using cold and fever OTC medication  Emergency and ibuprofen for relief.   He has been able to eat and drink without difficulty.   He had COVID about 2 years ago.  He has been vaccinated for COVID x2 not recent booster  He has not had a flu shot this year   Has a 48 year old daughter that had URI symptoms last week.   His wife has similar symptoms as well.   GFR 88  Problems:  Patient Active Problem List   Diagnosis Date Noted   Rectal bleeding 01/06/2021   Canker sore 09/11/2016   Health care maintenance 05/14/2016   Insomnia 02/16/2016   Erectile dysfunction 12/18/2015   Vitamin D deficiency 09/27/2015   Situational mixed anxiety and depressive disorder 08/30/2015   Bee sting reaction 02/22/2014   Pain  of right thumb 12/10/2012   Poison ivy dermatitis 09/25/2012   Impetigo 09/25/2012   Rash and nonspecific skin eruption 03/15/2011    Allergies: No Known Allergies Medications:  Current Outpatient Medications:    LORazepam (ATIVAN) 0.5 MG tablet, Take 1 - 2 tablets by mouth 2 times daily as needed for anxiety., Disp: 60 tablet, Rfl: 1   tadalafil (CIALIS) 20 MG tablet, Take 1 tablet (20 mg total) by mouth daily as needed for erectile dysfunction., Disp: 48 tablet, Rfl: 5  Observations/Objective: Patient is well-developed, well-nourished in no acute distress.  Resting comfortably at home.  Head is normocephalic, atraumatic.  No labored breathing.  Speech is clear and coherent with logical content.   Patient is alert and oriented at baseline.    Assessment and Plan: 1. Viral upper respiratory tract infection  - COVID-19 At Home Antigen Test [facility] COVID-19 AG HOME TEST) KIT; Use as directed.  Dispense: 2 kit; Refill: 1    Patient will message provider if home COVID test is positive for further instruction.  Will otherwise use OTC medications for URI support as discussed  Follow Up Instructions: I discussed the assessment and treatment plan with the patient. The patient was provided an opportunity to ask questions and all were answered. The patient agreed with the plan and demonstrated an understanding of the instructions.  A copy of instructions were sent to the patient via MyChart unless otherwise noted below.     The patient was advised to call back or seek an in-person evaluation if the symptoms worsen or if the condition fails to improve as anticipated.  Time:  I spent 20 minutes with the patient via telehealth technology discussing the above problems/concerns.    Apolonio Schneiders, FNP

## 2021-04-04 ENCOUNTER — Other Ambulatory Visit (HOSPITAL_COMMUNITY): Payer: Self-pay

## 2021-04-04 MED ORDER — OSELTAMIVIR PHOSPHATE 75 MG PO CAPS
75.0000 mg | ORAL_CAPSULE | Freq: Two times a day (BID) | ORAL | 0 refills | Status: AC
  Filled 2021-04-04: qty 10, 5d supply, fill #0

## 2021-04-26 ENCOUNTER — Other Ambulatory Visit: Payer: Self-pay | Admitting: Internal Medicine

## 2021-04-27 ENCOUNTER — Other Ambulatory Visit (HOSPITAL_COMMUNITY): Payer: Self-pay

## 2021-04-27 MED ORDER — LORAZEPAM 0.5 MG PO TABS
0.5000 mg | ORAL_TABLET | Freq: Two times a day (BID) | ORAL | 0 refills | Status: DC | PRN
  Filled 2021-04-27: qty 60, 20d supply, fill #0

## 2021-06-08 ENCOUNTER — Other Ambulatory Visit: Payer: Self-pay | Admitting: Internal Medicine

## 2021-06-09 ENCOUNTER — Other Ambulatory Visit (HOSPITAL_COMMUNITY): Payer: Self-pay

## 2021-06-09 ENCOUNTER — Encounter: Payer: Self-pay | Admitting: Internal Medicine

## 2021-06-10 ENCOUNTER — Encounter: Payer: Self-pay | Admitting: Internal Medicine

## 2021-06-10 ENCOUNTER — Other Ambulatory Visit (HOSPITAL_COMMUNITY): Payer: Self-pay

## 2021-06-12 ENCOUNTER — Other Ambulatory Visit (HOSPITAL_COMMUNITY): Payer: Self-pay

## 2021-06-12 NOTE — Telephone Encounter (Signed)
Pt checking status of refill request, pt states has not been able to sleep and the medication helps him sleep ?

## 2021-06-14 ENCOUNTER — Other Ambulatory Visit (HOSPITAL_COMMUNITY): Payer: Self-pay

## 2021-06-14 ENCOUNTER — Other Ambulatory Visit: Payer: Self-pay | Admitting: Internal Medicine

## 2021-06-14 MED ORDER — LORAZEPAM 0.5 MG PO TABS
0.5000 mg | ORAL_TABLET | Freq: Two times a day (BID) | ORAL | 0 refills | Status: DC | PRN
  Filled 2021-06-14: qty 60, 15d supply, fill #0

## 2021-06-21 ENCOUNTER — Other Ambulatory Visit (HOSPITAL_COMMUNITY): Payer: Self-pay

## 2021-06-21 ENCOUNTER — Encounter: Payer: Self-pay | Admitting: Gastroenterology

## 2021-06-21 ENCOUNTER — Ambulatory Visit (INDEPENDENT_AMBULATORY_CARE_PROVIDER_SITE_OTHER): Payer: 59 | Admitting: Gastroenterology

## 2021-06-21 VITALS — BP 108/72 | HR 60 | Ht 74.0 in | Wt 215.8 lb

## 2021-06-21 DIAGNOSIS — R21 Rash and other nonspecific skin eruption: Secondary | ICD-10-CM

## 2021-06-21 DIAGNOSIS — K625 Hemorrhage of anus and rectum: Secondary | ICD-10-CM | POA: Diagnosis not present

## 2021-06-21 MED ORDER — PLENVU 140 G PO SOLR
ORAL | 0 refills | Status: DC
  Filled 2021-06-21 – 2021-07-31 (×2): qty 1, 1d supply, fill #0

## 2021-06-21 NOTE — Progress Notes (Signed)
? ? ? ?06/21/2021 ?James Orr ?[id number] ?07-22-1980 ? ? ?HISTORY OF PRESENT ILLNESS: This is a 41 year old male who is here today for evaluation of rectal bleeding.  He tells me this has been an intermittent issue for the past 3 years or so.  He says that overall he has seen very small amounts of rectal bleeding even dating back to his 35s and he always just attributed it to drinking alcohol and hemorrhoids.  Then about 3 years ago he had an episode of bleeding that was quite a bit where it is dripping in the toilet and he could not get it to stop for about 15 minutes.  He has had some episodes recently where he is wiped and had clots on the toilet paper.  He says that he gets occasional diarrhea, maybe a couple times per week, but for the most part he has fairly regular bowel movements.  No major episodes of constipation.  He used to drink alcohol heavily, has cut back on that somewhat and is trying to take better care of himself.  He tells me that he gets eczema on his legs, etc.  He says he had a rash on his abdomen as well and and nobody could figure out what it was.  His PCP told him to go on a gluten-free diet, which he did for about 8 weeks and the rash resolved, however, he thought it could have been due to being outdoors at the [place] all summer and location had changed around the time that the rash resolved as well.  He is no longer on a gluten-free diet and has not had any major issues with the rash returning on his abdomen.  Basic labs in September 2022 by his PCP looked good except for some elevated cholesterol levels.  Looks like celiac lab orders were entered by his PCP back in 2018 but they were never drawn. ? ?Referred here by Dr. Alain Marion for evaluation of rectal bleeding and rash. ? ?Past Medical History:  ?Diagnosis Date  ? Anxiety   ? counsellor seen X 2 years  ? ?Past Surgical History:  ?Procedure Laterality Date  ? WISDOM TOOTH EXTRACTION    ? ? reports that he quit smoking about 10  years ago. His smoking use included cigarettes. He smoked an average of .5 packs per day. He has been exposed to tobacco smoke. He has never used smokeless tobacco. He reports current alcohol use. He reports current drug use. Drug: Marijuana. ?family history includes Alcohol abuse in his maternal grandfather and maternal grandmother; Anxiety disorder in his brother, maternal grandmother, and mother; Arthritis in his maternal grandmother; COPD in his maternal grandmother; Colon polyps in his maternal grandmother and mother; Depression in his brother; Diabetes in his brother; Gonadal disorder in his brother; Hyperlipidemia in his brother, maternal grandfather, maternal grandmother, and mother; Hypertension in his brother, maternal grandfather, maternal grandmother, and mother; Lung cancer in his maternal grandfather and maternal grandmother; Osteoporosis in his maternal grandmother and mother; Parkinsonism in his father. ?No Known Allergies ? ?  ?Outpatient Encounter Medications as of 06/21/2021  ?Medication Sig  ? LORazepam (ATIVAN) 0.5 MG tablet Take 1 - 2 tablets by mouth 2 times daily as needed for anxiety.  ? tadalafil (CIALIS) 20 MG tablet Take 1 tablet (20 mg total) by mouth daily as needed for erectile dysfunction.  ? [DISCONTINUED] COVID-19 At Home Antigen Test ([facility] COVID-19 HOME TEST) KIT Use as directed within package instructions  ? ?No facility-administered encounter  medications on file as of 06/21/2021.  ? ? ? ?REVIEW OF SYSTEMS  : All other systems reviewed and negative except where noted in the History of Present Illness. ? ? ?PHYSICAL EXAM: ?BP 108/72   Pulse 60   Ht $R'6\' 2"'Dg$  (1.88 m)   Wt 215 lb 12.8 oz (97.9 kg)   BMI 27.71 kg/m?  ?General: Well developed white male in no acute distress ?Head: Normocephalic and atraumatic ?Eyes: Sclerae anicteric, conjunctiva pink. ?Ears: Normal auditory acuity ?Lungs: Clear throughout to auscultation; no W/R/R. ?Heart: Regular rate and rhythm; no  M/R/G. ?Abdomen: Soft, non-distended.  BS present.  Non-tender. ?Rectal:  Will be done at the time of colonoscopy. ?Musculoskeletal: Symmetrical with no gross deformities  ?Skin: No lesions on visible extremities ?Extremities: No edema  ?Neurological: Alert oriented x 4, grossly non-focal ?Psychological:  Alert and cooperative. Normal mood and affect ? ?ASSESSMENT AND PLAN: ?*Rectal bleeding, intermittent for 3 years or so.  Sometimes large amounts with clots on the toilet paper.  Suspect hemorrhoidal bleeding, but certainly deserves colonoscopy to rule out other sources.  This is being scheduled Dr. Rush Landmark.  The risks, benefits, and alternatives to colonoscopy were discussed with the patient and he consents to proceed.  ?*Rash: Says that he gets eczema on his legs, but also had a rash on his abdomen that know nobody could figure out what it was.  He thought maybe was related to the fact that he basically lives at a [place] all summer and something related to outdoors or to the lake, etc.  Nonetheless, he went on a gluten-free diet for 8 weeks and the rash resolved, but he has since gone back on a gluten-containing diet and has not had much issues with that reoccurring.  We will check celiac labs just to be sure. ? ? ?CC:  James, [name], MD ? ?  ?

## 2021-06-21 NOTE — Progress Notes (Signed)
Attending Physician's Attestation   I have reviewed the chart.   I agree with the Advanced Practitioner's note, impression, and recommendations with any updates as below.    Carlyle Mcelrath Mansouraty, MD [facility] Office # [phone]  

## 2021-06-21 NOTE — Patient Instructions (Addendum)
If you are age 42 or older, your body mass index should be between 23-30. Your Body mass index is 27.71 kg/m?Marland Kitchen If this is out of the aforementioned range listed, please consider follow up with your Primary Care Provider. ? ?If you are age 73 or younger, your body mass index should be between 19-25. Your Body mass index is 27.71 kg/m?Marland Kitchen If this is out of the aformentioned range listed, please consider follow up with your Primary Care Provider.  ? ?You have been scheduled for a colonoscopy. Please follow written instructions given to you at your visit today.  ?Please pick up your prep supplies at the pharmacy within the next 1-3 days. ?If you use inhalers (even only as needed), please bring them with you on the day of your procedure.  ? ?The [facility] providers would like to encourage you to use [facility] to communicate with providers for non-urgent requests or questions.  Due to long hold times on the telephone, sending your provider a message by [facility] may be a faster and more efficient way to get a response.  Please allow 48 business hours for a response.  Please remember that this is for non-urgent requests.  ? ?It was a pleasure to see you today! ? ?Thank you for trusting me with your gastrointestinal care!   ? ?Doug Sou, PA-C  ? ?

## 2021-06-29 ENCOUNTER — Other Ambulatory Visit (HOSPITAL_COMMUNITY): Payer: Self-pay

## 2021-07-11 ENCOUNTER — Encounter: Payer: 59 | Admitting: Gastroenterology

## 2021-07-26 ENCOUNTER — Encounter: Payer: Self-pay | Admitting: Internal Medicine

## 2021-07-31 ENCOUNTER — Telehealth (INDEPENDENT_AMBULATORY_CARE_PROVIDER_SITE_OTHER): Payer: 59 | Admitting: Internal Medicine

## 2021-07-31 ENCOUNTER — Encounter: Payer: Self-pay | Admitting: Internal Medicine

## 2021-07-31 ENCOUNTER — Other Ambulatory Visit (HOSPITAL_COMMUNITY): Payer: Self-pay

## 2021-07-31 DIAGNOSIS — F5102 Adjustment insomnia: Secondary | ICD-10-CM

## 2021-07-31 DIAGNOSIS — F4323 Adjustment disorder with mixed anxiety and depressed mood: Secondary | ICD-10-CM

## 2021-07-31 MED ORDER — LORAZEPAM 0.5 MG PO TABS
0.5000 mg | ORAL_TABLET | Freq: Two times a day (BID) | ORAL | 2 refills | Status: DC | PRN
  Filled 2021-07-31: qty 60, 20d supply, fill #0
  Filled 2021-09-07: qty 60, 20d supply, fill #1
  Filled 2021-10-17: qty 60, 20d supply, fill #2

## 2021-07-31 MED ORDER — BUPROPION HCL ER (XL) 150 MG PO TB24
150.0000 mg | ORAL_TABLET | Freq: Every day | ORAL | 1 refills | Status: DC
  Filled 2021-07-31: qty 30, 30d supply, fill #0
  Filled 2021-09-07: qty 30, 30d supply, fill #1
  Filled 2021-10-17: qty 30, 30d supply, fill #2
  Filled 2021-12-07: qty 30, 30d supply, fill #3
  Filled 2022-01-08: qty 60, 60d supply, fill #4
  Filled 2022-01-19: qty 30, 30d supply, fill #4

## 2021-07-31 NOTE — Assessment & Plan Note (Signed)
James Orr has been under a lot of stress.  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  The merger is going to happen in July.  He is worried about his position.  His mother-in-law died 4 months ago.  He had to put his dad in the retirement home. ?We will refer to neuropsychology-Dr. Gutterman ?Start Wellbutrin XL 150 mg daily in the morning ?Continue with lorazepam as needed ?RTC  3 months ?

## 2021-07-31 NOTE — Progress Notes (Signed)
Virtual Visit via Video Note ? ?I connected with James Orr on 07/31/21 at  8:50 AM EDT by a video enabled telemedicine application and verified that I am speaking with the correct person using two identifiers. ?  ?I discussed the limitations of evaluation and management by telemedicine and the availability of in person appointments. The patient expressed understanding and agreed to proceed. ? ?I was located at our [facility] office. ?The patient was at home. ?There was no one else present in the visit. ? ?No chief complaint on file. ?  ? ?History of Present Illness: ?James Orr complaining of anxiety, insomnia.  He has been under a lot of stress.  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  The merger is going to happen in July.  He is worried about his position.  His mother-in-law died 4 months ago.  He had to put his dad in the retirement home. ? ?He has been using lorazepam to help him to sleep. ? ?Review of Systems  ?Constitutional:  Positive for malaise/fatigue.  ?Psychiatric/Behavioral:  Positive for depression. Negative for suicidal ideas. The patient is nervous/anxious and has insomnia.   ? ? ?Observations/Objective: ?The patient appears to be in no acute distress ?He is somewhat sad ? ?Assessment and Plan: ? ?Problem List Items Addressed This Visit   ? ? Situational mixed anxiety and depressive disorder  ?  James Orr has been under a lot of stress.  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  The merger is going to happen in July.  He is worried about his position.  His mother-in-law died 4 months ago.  He had to put his dad in the retirement home. ?We will refer to neuropsychology-Dr. Dellia Cloud ?Start Wellbutrin XL 150 mg daily in the morning ?Continue with lorazepam as needed ?RTC  3 months ?  ?  ? Relevant Medications  ? LORazepam (ATIVAN) 0.5 MG tablet  ? buPROPion (WELLBUTRIN XL) 150 MG 24 hr tablet  ? Other Relevant Orders  ? Ambulatory referral to Psychology  ? Insomnia  ?  James Orr has been under a lot of stress.  He is company  was bought out.  The merger is going to happen in July.  He is worried about his position.  His mother-in-law died 4 months ago.  He had to put his dad in the retirement home. ?We will refer to neuropsychology-Dr. Dellia Cloud ?Start Wellbutrin XL 150 mg daily in the morning ?Continue with lorazepam as needed ?RTC  3 months ?  ?  ? Relevant Orders  ? Ambulatory referral to Psychology  ? ? ? ?Meds ordered this encounter  ?Medications  ? LORazepam (ATIVAN) 0.5 MG tablet  ?  Sig: Take 1 - 2 tablets by mouth 2 times daily as needed for anxiety.  ?  Dispense:  60 tablet  ?  Refill:  2  ? buPROPion (WELLBUTRIN XL) 150 MG 24 hr tablet  ?  Sig: Take 1 tablet (150 mg total) by mouth daily.  ?  Dispense:  90 tablet  ?  Refill:  1  ?  ? ?Follow Up Instructions: ? ?  ?I discussed the assessment and treatment plan with the patient. The patient was provided an opportunity to ask questions and all were answered. The patient agreed with the plan and demonstrated an understanding of the instructions. ?  ?The patient was advised to call back or seek an in-person evaluation if the symptoms worsen or if the condition fails to improve as anticipated. ? ?I provided face-to-face time during  this encounter. We were at different locations. ? ? ?Sonda Primes, MD ? ?

## 2021-07-31 NOTE — Assessment & Plan Note (Signed)
James Orr has been under a lot of stress.  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  The merger is going to happen in July.  He is worried about his position.  His mother-in-law died 4 months ago.  He had to put his dad in the retirement home. ?We will refer to neuropsychology-Dr. Dellia Cloud ?Start Wellbutrin XL 150 mg daily in the morning ?Continue with lorazepam as needed ?RTC  3 months ?

## 2021-08-01 ENCOUNTER — Encounter: Payer: Self-pay | Admitting: Gastroenterology

## 2021-08-08 ENCOUNTER — Ambulatory Visit (AMBULATORY_SURGERY_CENTER): Payer: 59 | Admitting: Gastroenterology

## 2021-08-08 ENCOUNTER — Encounter: Payer: Self-pay | Admitting: Gastroenterology

## 2021-08-08 ENCOUNTER — Other Ambulatory Visit (HOSPITAL_COMMUNITY): Payer: Self-pay

## 2021-08-08 VITALS — BP 119/73 | HR 58 | Temp 97.3°F | Resp 19 | Ht 74.0 in | Wt 215.0 lb

## 2021-08-08 DIAGNOSIS — K644 Residual hemorrhoidal skin tags: Secondary | ICD-10-CM

## 2021-08-08 DIAGNOSIS — K625 Hemorrhage of anus and rectum: Secondary | ICD-10-CM

## 2021-08-08 DIAGNOSIS — K641 Second degree hemorrhoids: Secondary | ICD-10-CM | POA: Diagnosis not present

## 2021-08-08 MED ORDER — HYDROCORTISONE ACETATE 25 MG RE SUPP
25.0000 mg | Freq: Every evening | RECTAL | 1 refills | Status: DC | PRN
  Filled 2021-08-08: qty 12, 12d supply, fill #0

## 2021-08-08 MED ORDER — SODIUM CHLORIDE 0.9 % IV SOLN: 500.0000 mL | Freq: Once | INTRAVENOUS | Status: DC

## 2021-08-08 NOTE — Op Note (Signed)
[place] ?Patient Name: James Orr ?Procedure Date: 08/08/2021 11:11 AM ?MRN: [id number] ?Endoscopist: Justice Britain , MD ?Age: 41 ?Referring MD:  ?Date of Birth: [birth year] ?Gender: Male ?Account #: [id number] ?Procedure:                Colonoscopy ?Indications:              Colon cancer screening in patient at increased  ?                          risk: Family history of colon polyps in 1st-degree  ?                          relative and 2nd-degree relative, Incidental -  ?                          Hematochezia ?Medicines:                Monitored Anesthesia Care ?Procedure:                Pre-Anesthesia Assessment: ?                          - Prior to the procedure, a History and Physical  ?                          was performed, and patient medications and  ?                          allergies were reviewed. The patient's tolerance of  ?                          previous anesthesia was also reviewed. The risks  ?                          and benefits of the procedure and the sedation  ?                          options and risks were discussed with the patient.  ?                          All questions were answered, and informed consent  ?                          was obtained. Prior Anticoagulants: The patient has  ?                          taken no previous anticoagulant or antiplatelet  ?                          agents. ASA Grade Assessment: II - A patient with  ?                          mild systemic disease. After reviewing the risks  ?  and benefits, the patient was deemed in  ?                          satisfactory condition to undergo the procedure. ?                          After obtaining informed consent, the colonoscope  ?                          was passed under direct vision. Throughout the  ?                          procedure, the patient's blood pressure, pulse, and  ?                          oxygen saturations were monitored continuously. The  ?                           Olympus CF-HQ190L Colonscope was introduced through  ?                          the anus and advanced to the 5 cm into the ileum.  ?                          The colonoscopy was performed without difficulty.  ?                          The patient tolerated the procedure. The quality of  ?                          the bowel preparation was good. The terminal ileum,  ?                          ileocecal valve, appendiceal orifice, and rectum  ?                          were photographed. ?Scope In: 11:26:19 AM ?Scope Out: 11:38:11 AM ?Scope Withdrawal Time: 0 hours 6 minutes 57 seconds  ?Total Procedure Duration: 0 hours 11 minutes 52 seconds  ?Findings:                 Skin tags were found on perianal exam. ?                          The digital rectal exam findings include  ?                          hemorrhoids. Pertinent negatives include no  ?                          palpable rectal lesions. ?                          The terminal ileum and ileocecal valve appeared  ?  normal. ?                          Normal mucosa was found in the entire colon. ?                          Non-bleeding non-thrombosed external and internal  ?                          hemorrhoids were found during retroflexion, during  ?                          perianal exam and during digital exam. The  ?                          hemorrhoids were Grade II (internal hemorrhoids  ?                          that prolapse but reduce spontaneously). ?Complications:            No immediate complications. ?Estimated Blood Loss:     Estimated blood loss: none. ?Impression:               - Perianal skin tags found on perianal exam. ?                          - Hemorrhoids found on digital rectal exam. ?                          - The examined portion of the ileum was normal. ?                          - Normal mucosa in the entire examined colon. ?                          - Non-bleeding non-thrombosed  external and internal  ?                          hemorrhoids. ?Recommendation:           - The patient will be observed post-procedure,  ?                          until all discharge criteria are met. ?                          - Discharge patient to home. ?                          - Patient has a contact number available for  ?                          emergencies. The signs and symptoms of potential  ?                          delayed complications were discussed with the  ?  patient. Return to normal activities tomorrow.  ?                          Written discharge instructions were provided to the  ?                          patient. ?                          - High fiber diet. ?                          - Use FiberCon 1-2 tablets PO daily. ?                          - Continue present medications. ?                          - Repeat colonoscopy in 10 years for screening  ?                          purposes. ?                          - Anusol suppositories can be considered in future  ?                          if needed. ?                          - If rectal bleeding persists, will discuss with  ?                          colleagues to consider Internal Hemorrhoidal  ?                          banding with GI vs evaluation with CRS for Int/Ext  ?                          hemorrhoidal therapies. ?                          - The findings and recommendations were discussed  ?                          with the patient. ?                          - The findings and recommendations were discussed  ?                          with the patient's family. ?Justice Britain, MD ?08/08/2021 11:43:16 AM ?

## 2021-08-08 NOTE — Progress Notes (Signed)
D/c instructions per Murrell Redden, pt refused anusol supp Rx due to insurance not covering, he also refused any other Rx for his hemorrhoids. Dr Meridee Score notified. ?

## 2021-08-08 NOTE — Progress Notes (Signed)
? ?GASTROENTEROLOGY PROCEDURE H&P NOTE  ? ?Primary Care Physician: ?Plotnikov, Georgina Quint, MD ? ?HPI: ?James Orr is a 41 y.o. male who presents for Colonoscopy for screening in setting of family history of colon polyps and also history of rectal bleeding. ? ?Past Medical History:  ?Diagnosis Date  ? Anxiety   ? counsellor seen X 2 years  ? ?Past Surgical History:  ?Procedure Laterality Date  ? WISDOM TOOTH EXTRACTION    ? ?Current Outpatient Medications  ?Medication Sig Dispense Refill  ? buPROPion (WELLBUTRIN XL) 150 MG 24 hr tablet Take 1 tablet (150 mg total) by mouth daily. 90 tablet 1  ? LORazepam (ATIVAN) 0.5 MG tablet Take 1 - 2 tablets by mouth 2 times daily as needed for anxiety. 60 tablet 2  ? tadalafil (CIALIS) 20 MG tablet Take 1 tablet (20 mg total) by mouth daily as needed for erectile dysfunction. 48 tablet 5  ? ?Current Facility-Administered Medications  ?Medication Dose Route Frequency Provider Last Rate Last Admin  ? 0.9 %  sodium chloride infusion  500 mL Intravenous Once Mansouraty, Netty Starring., MD      ? ? ?Current Outpatient Medications:  ?  buPROPion (WELLBUTRIN XL) 150 MG 24 hr tablet, Take 1 tablet (150 mg total) by mouth daily., Disp: 90 tablet, Rfl: 1 ?  LORazepam (ATIVAN) 0.5 MG tablet, Take 1 - 2 tablets by mouth 2 times daily as needed for anxiety., Disp: 60 tablet, Rfl: 2 ?  tadalafil (CIALIS) 20 MG tablet, Take 1 tablet (20 mg total) by mouth daily as needed for erectile dysfunction., Disp: 48 tablet, Rfl: 5 ? ?Current Facility-Administered Medications:  ?  0.9 %  sodium chloride infusion, 500 mL, Intravenous, Once, Mansouraty, Netty Starring., MD ?No Known Allergies ?Family History  ?Problem Relation Age of Onset  ? Hypertension Mother   ? Hyperlipidemia Mother   ? Anxiety disorder Mother   ? Colon polyps Mother   ? Osteoporosis Mother   ? Parkinsonism Father   ? Hypertension Brother   ? Hyperlipidemia Brother   ? Diabetes Brother   ? Depression Brother   ? Anxiety disorder Brother    ? Gonadal disorder Brother   ?     low Testosterone; also M uncles & MGF  ? Lung cancer Maternal Grandmother   ? Hypertension Maternal Grandmother   ? Hyperlipidemia Maternal Grandmother   ? Arthritis Maternal Grandmother   ? Alcohol abuse Maternal Grandmother   ? Anxiety disorder Maternal Grandmother   ? Colon polyps Maternal Grandmother   ? Osteoporosis Maternal Grandmother   ? COPD Maternal Grandmother   ? Hypertension Maternal Grandfather   ? Hyperlipidemia Maternal Grandfather   ? Alcohol abuse Maternal Grandfather   ? Lung cancer Maternal Grandfather   ? Colon cancer Neg Hx   ? ?Social History  ? ?Socioeconomic History  ? Marital status: Married  ?  Spouse name: Not on file  ? Number of children: Not on file  ? Years of education: Not on file  ? Highest education level: Not on file  ?Occupational History  ? Not on file  ?Tobacco Use  ? Smoking status: Former  ?  Packs/day: 0.50  ?  Types: Cigarettes  ?  Quit date: 12/04/2010  ?  Years since quitting: 10.6  ?  Passive exposure: Past  ? Smokeless tobacco: Never  ? Tobacco comments:  ?  Started 1996, quit x 7 years then restarted in 2007  ?Vaping Use  ? Vaping Use: Never used  ?  Substance and Sexual Activity  ? Alcohol use: Yes  ?  Comment: 3-4 drinks, not every day, maybe 10-15 per week  ? Drug use: Yes  ?  Types: Marijuana  ?  Comment: once a month  ? Sexual activity: Not on file  ?Other Topics Concern  ? Not on file  ?Social History Narrative  ? Not on file  ? ?Social Determinants of Health  ? ?Financial Resource Strain: Not on file  ?Food Insecurity: Not on file  ?Transportation Needs: Not on file  ?Physical Activity: Not on file  ?Stress: Not on file  ?Social Connections: Not on file  ?Intimate Partner Violence: Not on file  ? ? ?Physical Exam: ?Today's Vitals  ? 08/08/21 1016 08/08/21 1017  ?BP: 128/68   ?Pulse: 63   ?Temp: (!) 97.3 ?F (36.3 ?C) (!) 97.3 ?F (36.3 ?C)  ?SpO2: 99%   ?Weight: 215 lb (97.5 kg)   ?Height: 6\' 2"  (1.88 m)   ? ?Body mass index is  27.6 kg/m?. ?GEN: NAD ?EYE: Sclerae anicteric ?ENT: MMM ?CV: Non-tachycardic ?GI: Soft, NT/ND ?NEURO:  Alert & Oriented x 3 ? ?Lab Results: ?No results for input(s): WBC, HGB, HCT, PLT in the last 72 hours. ?BMET ?No results for input(s): NA, K, CL, CO2, GLUCOSE, BUN, CREATININE, CALCIUM in the last 72 hours. ?LFT ?No results for input(s): PROT, ALBUMIN, AST, ALT, ALKPHOS, BILITOT, BILIDIR, IBILI in the last 72 hours. ?PT/INR ?No results for input(s): LABPROT, INR in the last 72 hours. ? ? ?Impression / Plan: ?This is a 41 y.o.male who presents for Colonoscopy for screening in setting of family history of colon polyps and also history of rectal bleeding. ? ?The risks and benefits of endoscopic evaluation/treatment were discussed with the patient and/or family; these include but are not limited to the risk of perforation, infection, bleeding, missed lesions, lack of diagnosis, severe illness requiring hospitalization, as well as anesthesia and sedation related illnesses.  The patient's history has been reviewed, patient examined, no change in status, and deemed stable for procedure.  The patient and/or family is agreeable to proceed.  ? ? ?45, MD ?[facility] ?Advanced Endoscopy ?Office # [place] ? ?

## 2021-08-08 NOTE — Progress Notes (Signed)
VS  DT ? ?Pt's states no medical or surgical changes since previsit or office visit. ? ?

## 2021-08-08 NOTE — Patient Instructions (Signed)
Handouts given for hemorrhoids and hemorrhoid banding. ? ?YOU HAD AN ENDOSCOPIC PROCEDURE TODAY AT THE [facility]:   Refer to the procedure report that was given to you for any specific questions about what was found during the examination.  If the procedure report does not answer your questions, please call your gastroenterologist to clarify.  If you requested that your care partner not be given the details of your procedure findings, then the procedure report has been included in a sealed envelope for you to review at your convenience later. ? ?YOU SHOULD EXPECT: Some feelings of bloating in the abdomen. Passage of more gas than usual.  Walking can help get rid of the air that was put into your GI tract during the procedure and reduce the bloating. If you had a lower endoscopy (such as a colonoscopy or flexible sigmoidoscopy) you may notice spotting of blood in your stool or on the toilet paper. If you underwent a bowel prep for your procedure, you may not have a normal bowel movement for a few days. ? ?Please Note:  You might notice some irritation and congestion in your nose or some drainage.  This is from the oxygen used during your procedure.  There is no need for concern and it should clear up in a day or so. ? ?SYMPTOMS TO REPORT IMMEDIATELY: ? ?Following lower endoscopy (colonoscopy): ? Excessive amounts of blood in the stool ? Significant tenderness or worsening of abdominal pains ? Swelling of the abdomen that is new, acute ? Fever of 100?F or higher ? ?For urgent or emergent issues, a gastroenterologist can be reached at any hour by calling [phone]. ?Do not use MyChart messaging for urgent concerns.  ? ? ?DIET:  We do recommend a small meal at first, but then you may proceed to your regular diet.  Drink plenty of fluids but you should avoid alcoholic beverages for 24 hours. ? ?ACTIVITY:  You should plan to take it easy for the rest of today and you should NOT DRIVE or use heavy  machinery until tomorrow (because of the sedation medicines used during the test).   ? ?FOLLOW UP: ?Our staff will call the number listed on your records 48-72 hours following your procedure to check on you and address any questions or concerns that you may have regarding the information given to you following your procedure. If we do not reach you, we will leave a message.  We will attempt to reach you two times.  During this call, we will ask if you have developed any symptoms of COVID 19. If you develop any symptoms (ie: fever, flu-like symptoms, shortness of breath, cough etc.) before then, please call [phone].  If you test positive for Covid 19 in the 2 weeks post procedure, please call and report this information to [place].   ? ?There were no polyps seen today!  You will need another screening colonoscopy in 10 years, you will receive a letter at that time when you are due for the procedure.   Please call us at [phone] if you have a change in bowel habits, change in family history of colo-rectal cancer, rectal bleeding or other GI concern before that time.  ? ? ?SIGNATURES/CONFIDENTIALITY: ?You and/or your care partner have signed paperwork which will be entered into your electronic medical record.  These signatures attest to the fact that that the information above on your After Visit Summary has been reviewed and is understood.  Full responsibility of the  confidentiality of this discharge information lies with you and/or your care-partner.  ?

## 2021-08-08 NOTE — Progress Notes (Signed)
Report to PACU, RN, vss, BBS= Clear.  

## 2021-08-10 ENCOUNTER — Telehealth: Payer: Self-pay

## 2021-08-10 NOTE — Telephone Encounter (Signed)
?  Follow up Call- ? ? ?  08/08/2021  ? 10:17 AM  ?Call back number  ?Post procedure Call Back phone  # [phone]  ?Permission to leave phone message Yes  ?  ? ?Patient questions: ? ?Do you have a fever, pain , or abdominal swelling? No. ?Pain Score  0 * ? ?Have you tolerated food without any problems? Yes.   ? ?Have you been able to return to your normal activities? Yes.   ? ?Do you have any questions about your discharge instructions: ?Diet   No. ?Medications  No. ?Follow up visit  No. ? ?Do you have questions or concerns about your Care? No. ? ?Actions: ?* If pain score is 4 or above: ?No action needed, pain <4. ? ? ?

## 2021-09-07 ENCOUNTER — Other Ambulatory Visit (HOSPITAL_COMMUNITY): Payer: Self-pay

## 2021-10-28 ENCOUNTER — Other Ambulatory Visit (HOSPITAL_COMMUNITY): Payer: Self-pay

## 2021-12-07 ENCOUNTER — Other Ambulatory Visit: Payer: Self-pay | Admitting: Internal Medicine

## 2021-12-08 ENCOUNTER — Other Ambulatory Visit (HOSPITAL_COMMUNITY): Payer: Self-pay

## 2021-12-08 MED ORDER — LORAZEPAM 0.5 MG PO TABS
0.5000 mg | ORAL_TABLET | Freq: Two times a day (BID) | ORAL | 0 refills | Status: DC | PRN
  Filled 2021-12-08: qty 60, 20d supply, fill #0

## 2021-12-13 ENCOUNTER — Other Ambulatory Visit (HOSPITAL_COMMUNITY): Payer: Self-pay

## 2022-01-08 ENCOUNTER — Other Ambulatory Visit: Payer: Self-pay | Admitting: Internal Medicine

## 2022-01-09 ENCOUNTER — Encounter: Payer: Self-pay | Admitting: Internal Medicine

## 2022-01-09 MED ORDER — TADALAFIL 20 MG PO TABS: 20.0000 mg | ORAL_TABLET | Freq: Every day | ORAL | 5 refills | Status: DC | PRN

## 2022-01-10 MED ORDER — TADALAFIL 20 MG PO TABS: 20.0000 mg | ORAL_TABLET | Freq: Every day | ORAL | 5 refills | Status: DC | PRN

## 2022-01-11 ENCOUNTER — Other Ambulatory Visit (HOSPITAL_COMMUNITY): Payer: Self-pay

## 2022-01-12 ENCOUNTER — Other Ambulatory Visit: Payer: Self-pay | Admitting: Internal Medicine

## 2022-01-12 ENCOUNTER — Other Ambulatory Visit (HOSPITAL_COMMUNITY): Payer: Self-pay

## 2022-01-15 MED ORDER — LORAZEPAM 0.5 MG PO TABS
0.5000 mg | ORAL_TABLET | Freq: Two times a day (BID) | ORAL | 0 refills | Status: DC | PRN
  Filled 2022-01-15: qty 60, 15d supply, fill #0

## 2022-01-16 ENCOUNTER — Other Ambulatory Visit (HOSPITAL_COMMUNITY): Payer: Self-pay

## 2022-01-17 ENCOUNTER — Other Ambulatory Visit: Payer: Self-pay | Admitting: Internal Medicine

## 2022-01-19 ENCOUNTER — Other Ambulatory Visit (HOSPITAL_COMMUNITY): Payer: Self-pay

## 2022-01-19 MED ORDER — TADALAFIL 20 MG PO TABS
20.0000 mg | ORAL_TABLET | Freq: Every day | ORAL | 5 refills | Status: DC | PRN
  Filled 2022-01-19: qty 10, 10d supply, fill #0
  Filled 2022-01-19: qty 20, 20d supply, fill #0
  Filled 2022-02-17: qty 30, 30d supply, fill #1
  Filled 2022-04-06: qty 30, 30d supply, fill #2

## 2022-01-25 ENCOUNTER — Encounter: Payer: Self-pay | Admitting: Internal Medicine

## 2022-01-29 NOTE — Telephone Encounter (Signed)
Called pt inform MD response. Made appt for tomorrow at 11:40am../lmb

## 2022-01-30 ENCOUNTER — Ambulatory Visit (INDEPENDENT_AMBULATORY_CARE_PROVIDER_SITE_OTHER): Payer: Managed Care, Other (non HMO) | Admitting: Internal Medicine

## 2022-01-30 ENCOUNTER — Other Ambulatory Visit (HOSPITAL_COMMUNITY): Payer: Self-pay

## 2022-01-30 ENCOUNTER — Encounter: Payer: Self-pay | Admitting: Internal Medicine

## 2022-01-30 VITALS — BP 102/70 | HR 67 | Temp 98.2°F | Ht 74.0 in | Wt 218.0 lb

## 2022-01-30 DIAGNOSIS — Z111 Encounter for screening for respiratory tuberculosis: Secondary | ICD-10-CM

## 2022-01-30 DIAGNOSIS — Z0000 Encounter for general adult medical examination without abnormal findings: Secondary | ICD-10-CM

## 2022-01-30 DIAGNOSIS — Z136 Encounter for screening for cardiovascular disorders: Secondary | ICD-10-CM

## 2022-01-30 DIAGNOSIS — F4323 Adjustment disorder with mixed anxiety and depressed mood: Secondary | ICD-10-CM

## 2022-01-30 DIAGNOSIS — Z23 Encounter for immunization: Secondary | ICD-10-CM

## 2022-01-30 LAB — COMPREHENSIVE METABOLIC PANEL
ALT: 27 U/L (ref 0–53)
AST: 25 U/L (ref 0–37)
Albumin: 4.6 g/dL (ref 3.5–5.2)
Alkaline Phosphatase: 52 U/L (ref 39–117)
BUN: 14 mg/dL (ref 6–23)
CO2: 30 mEq/L (ref 19–32)
Calcium: 9.9 mg/dL (ref 8.4–10.5)
Chloride: 101 mEq/L (ref 96–112)
Creatinine, Ser: 1.04 mg/dL (ref 0.40–1.50)
GFR: 88.49 mL/min (ref >60.00)
Glucose, Bld: 94 mg/dL (ref 70–99)
Potassium: 4.5 mEq/L (ref 3.5–5.1)
Sodium: 136 mEq/L (ref 135–145)
Total Bilirubin: 0.6 mg/dL (ref 0.2–1.2)
Total Protein: 7.5 g/dL (ref 6.0–8.3)

## 2022-01-30 LAB — CBC WITH DIFFERENTIAL/PLATELET
Basophils Absolute: 0 10*3/uL (ref 0.0–0.1)
Basophils Relative: 0.6 % (ref 0.0–3.0)
Eosinophils Absolute: 0.2 10*3/uL (ref 0.0–0.7)
Eosinophils Relative: 3.7 % (ref 0.0–5.0)
HCT: 42.4 % (ref 39.0–52.0)
Hemoglobin: 13.9 g/dL (ref 13.0–17.0)
Lymphocytes Relative: 29.6 % (ref 12.0–46.0)
Lymphs Abs: 1.6 10*3/uL (ref 0.7–4.0)
MCHC: 32.9 g/dL (ref 30.0–36.0)
MCV: 91.4 fl (ref 78.0–100.0)
Monocytes Absolute: 0.5 10*3/uL (ref 0.1–1.0)
Monocytes Relative: 9.6 % (ref 3.0–12.0)
Neutro Abs: 3 10*3/uL (ref 1.4–7.7)
Neutrophils Relative %: 56.5 % (ref 43.0–77.0)
Platelets: 267 10*3/uL (ref 150.0–400.0)
RBC: 4.64 Mil/uL (ref 4.22–5.81)
RDW: 12.5 % (ref 11.5–15.5)
WBC: 5.4 10*3/uL (ref 4.0–10.5)

## 2022-01-30 LAB — PSA: PSA: 0.97 ng/mL (ref 0.10–4.00)

## 2022-01-30 LAB — URINALYSIS
Bilirubin Urine: NEGATIVE
Hgb urine dipstick: NEGATIVE
Ketones, ur: NEGATIVE
Leukocytes,Ua: NEGATIVE
Nitrite: NEGATIVE
Specific Gravity, Urine: 1.01 (ref 1.000–1.030)
Total Protein, Urine: NEGATIVE
Urine Glucose: NEGATIVE
Urobilinogen, UA: 0.2 (ref 0.0–1.0)
pH: 6 (ref 5.0–8.0)

## 2022-01-30 LAB — LIPID PANEL
Cholesterol: 259 mg/dL — ABNORMAL HIGH (ref 0–200)
HDL: 59.1 mg/dL (ref >39.00)
NonHDL: 199.51
Total CHOL/HDL Ratio: 4
Triglycerides: 263 mg/dL — ABNORMAL HIGH (ref 0.0–149.0)
VLDL: 52.6 mg/dL — ABNORMAL HIGH (ref 0.0–40.0)

## 2022-01-30 LAB — TSH: TSH: 1.91 u[IU]/mL (ref 0.35–5.50)

## 2022-01-30 LAB — LDL CHOLESTEROL, DIRECT: Direct LDL: 170 mg/dL

## 2022-01-30 MED ORDER — LORAZEPAM 0.5 MG PO TABS
0.5000 mg | ORAL_TABLET | Freq: Two times a day (BID) | ORAL | 2 refills | Status: DC | PRN
  Filled 2022-01-30 – 2022-02-17 (×2): qty 60, 15d supply, fill #0
  Filled 2022-04-05: qty 60, 15d supply, fill #1

## 2022-01-30 MED ORDER — BUPROPION HCL ER (XL) 150 MG PO TB24
150.0000 mg | ORAL_TABLET | Freq: Every day | ORAL | 2 refills | Status: DC
  Filled 2022-01-30 – 2022-02-17 (×2): qty 90, 90d supply, fill #0
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
## 2022-02-04 LAB — QUANTIFERON-TB GOLD PLUS
Mitogen-NIL: 6.5 IU/mL
NIL: 0.01 IU/mL
QuantiFERON-TB Gold Plus: NEGATIVE
TB1-NIL: 0.09 IU/mL
TB2-NIL: 0.05 IU/mL

## 2022-02-04 NOTE — Assessment & Plan Note (Signed)
Less stress with a new job. Continue with Wellbutrin XL 150 mg daily in the morning Continue with lorazepam as needed  Potential benefits of a long term benzodiazepines  use as well as potential risks  and complications were explained to the patient and were aknowledged.  RTC  3 months

## 2022-02-17 ENCOUNTER — Other Ambulatory Visit (HOSPITAL_COMMUNITY): Payer: Self-pay

## 2022-02-17 ENCOUNTER — Encounter: Payer: Self-pay | Admitting: Internal Medicine

## 2022-02-20 ENCOUNTER — Encounter: Payer: Self-pay | Admitting: Internal Medicine

## 2022-02-21 ENCOUNTER — Other Ambulatory Visit: Payer: Self-pay | Admitting: Internal Medicine

## 2022-02-21 ENCOUNTER — Encounter: Payer: Self-pay | Admitting: Internal Medicine

## 2022-02-21 DIAGNOSIS — Z021 Encounter for pre-employment examination: Secondary | ICD-10-CM

## 2022-02-22 ENCOUNTER — Other Ambulatory Visit (HOSPITAL_COMMUNITY): Payer: Self-pay

## 2022-02-22 ENCOUNTER — Other Ambulatory Visit: Payer: Managed Care, Other (non HMO)

## 2022-02-22 DIAGNOSIS — Z021 Encounter for pre-employment examination: Secondary | ICD-10-CM

## 2022-02-23 LAB — PMP SCREEN PROFILE (10S), URINE
Amphetamine Scrn, Ur: NEGATIVE ng/mL
BARBITURATE SCREEN URINE: NEGATIVE ng/mL
BENZODIAZEPINE SCREEN, URINE: NEGATIVE ng/mL
CANNABINOIDS UR QL SCN: NEGATIVE ng/mL
Cocaine (Metab) Scrn, Ur: NEGATIVE ng/mL
Creatinine(Crt), U: 108.1 mg/dL (ref 20.0–300.0)
Methadone Screen, Urine: NEGATIVE ng/mL
OXYCODONE+OXYMORPHONE UR QL SCN: NEGATIVE ng/mL
Opiate Scrn, Ur: NEGATIVE ng/mL
Ph of Urine: 5.8 (ref 4.5–8.9)
Phencyclidine Qn, Ur: NEGATIVE ng/mL
Propoxyphene Scrn, Ur: NEGATIVE ng/mL

## 2022-04-06 ENCOUNTER — Other Ambulatory Visit: Payer: Self-pay

## 2022-04-06 ENCOUNTER — Other Ambulatory Visit (HOSPITAL_COMMUNITY): Payer: Self-pay

## 2022-04-20 ENCOUNTER — Other Ambulatory Visit (HOSPITAL_BASED_OUTPATIENT_CLINIC_OR_DEPARTMENT_OTHER): Payer: Self-pay

## 2022-04-20 ENCOUNTER — Other Ambulatory Visit (HOSPITAL_COMMUNITY): Payer: Self-pay

## 2022-04-20 MED ORDER — MECLIZINE HCL 25 MG PO TABS
25.0000 mg | ORAL_TABLET | Freq: Three times a day (TID) | ORAL | 0 refills | Status: AC | PRN
  Filled 2022-04-20: qty 15, 5d supply, fill #0

## 2022-04-30 ENCOUNTER — Ambulatory Visit (INDEPENDENT_AMBULATORY_CARE_PROVIDER_SITE_OTHER): Payer: Managed Care, Other (non HMO) | Admitting: Internal Medicine

## 2022-04-30 ENCOUNTER — Other Ambulatory Visit (HOSPITAL_COMMUNITY): Payer: Self-pay

## 2022-04-30 ENCOUNTER — Encounter: Payer: Self-pay | Admitting: Internal Medicine

## 2022-04-30 VITALS — BP 118/80 | HR 62 | Temp 98.2°F | Ht 74.0 in | Wt 219.0 lb

## 2022-04-30 DIAGNOSIS — H6690 Otitis media, unspecified, unspecified ear: Secondary | ICD-10-CM | POA: Insufficient documentation

## 2022-04-30 DIAGNOSIS — F4323 Adjustment disorder with mixed anxiety and depressed mood: Secondary | ICD-10-CM | POA: Diagnosis not present

## 2022-04-30 DIAGNOSIS — E559 Vitamin D deficiency, unspecified: Secondary | ICD-10-CM | POA: Diagnosis not present

## 2022-04-30 MED ORDER — CEFDINIR 300 MG PO CAPS
300.0000 mg | ORAL_CAPSULE | Freq: Two times a day (BID) | ORAL | 0 refills | Status: DC
  Filled 2022-04-30: qty 20, 10d supply, fill #0

## 2022-04-30 MED ORDER — BUPROPION HCL ER (XL) 150 MG PO TB24
150.0000 mg | ORAL_TABLET | Freq: Every day | ORAL | 2 refills | Status: AC
  Filled 2022-04-30 – 2022-06-29 (×2): qty 90, 90d supply, fill #0
  Filled 2023-01-02: qty 90, 90d supply, fill #1

## 2022-04-30 MED ORDER — PSEUDOEPHEDRINE HCL ER 120 MG PO TB12
120.0000 mg | ORAL_TABLET | Freq: Two times a day (BID) | ORAL | 1 refills | Status: AC | PRN
  Filled 2022-04-30: qty 60, 30d supply, fill #0

## 2022-04-30 MED ORDER — LORAZEPAM 0.5 MG PO TABS
0.5000 mg | ORAL_TABLET | Freq: Two times a day (BID) | ORAL | 2 refills | Status: DC | PRN
  Filled 2022-04-30: qty 60, 15d supply, fill #0
  Filled 2022-06-29: qty 60, 15d supply, fill #1
  Filled 2022-08-22: qty 60, 15d supply, fill #2

## 2022-04-30 MED ORDER — TADALAFIL 20 MG PO TABS
20.0000 mg | ORAL_TABLET | Freq: Every day | ORAL | 5 refills | Status: DC | PRN
  Filled 2022-04-30: qty 30, 30d supply, fill #0
  Filled 2022-06-29: qty 30, 30d supply, fill #1
  Filled 2022-08-22: qty 30, 30d supply, fill #2
  Filled 2022-10-24: qty 30, 30d supply, fill #3
  Filled 2022-11-18: qty 30, 30d supply, fill #4
  Filled 2023-01-02: qty 30, 30d supply, fill #5

## 2022-04-30 NOTE — Assessment & Plan Note (Signed)
On Vit D 

## 2022-04-30 NOTE — Assessment & Plan Note (Signed)
Cont on Wellbutrin po Continue with lorazepam as needed  Potential benefits of a long term benzodiazepines  use as well as potential risks  and complications were explained to the patient and were aknowledged.
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
## 2022-04-30 NOTE — Assessment & Plan Note (Signed)
Start Omnicef Sudafed prn

## 2022-05-01 ENCOUNTER — Other Ambulatory Visit (HOSPITAL_COMMUNITY): Payer: Self-pay

## 2022-05-02 ENCOUNTER — Other Ambulatory Visit (HOSPITAL_COMMUNITY): Payer: Self-pay

## 2022-05-03 ENCOUNTER — Other Ambulatory Visit (HOSPITAL_COMMUNITY): Payer: Self-pay

## 2022-05-15 ENCOUNTER — Other Ambulatory Visit (HOSPITAL_COMMUNITY): Payer: Self-pay

## 2022-06-29 ENCOUNTER — Other Ambulatory Visit: Payer: Self-pay

## 2022-06-29 ENCOUNTER — Other Ambulatory Visit (HOSPITAL_COMMUNITY): Payer: Self-pay

## 2022-08-20 ENCOUNTER — Encounter: Payer: Self-pay | Admitting: Internal Medicine

## 2022-08-21 ENCOUNTER — Other Ambulatory Visit (HOSPITAL_COMMUNITY): Payer: Self-pay

## 2022-08-21 MED ORDER — OXYCODONE-ACETAMINOPHEN 5-325 MG PO TABS
1.0000 | ORAL_TABLET | ORAL | 0 refills | Status: AC | PRN
  Filled 2022-08-21 – 2022-08-29 (×2): qty 6, 1d supply, fill #0

## 2022-08-21 MED ORDER — DIAZEPAM 5 MG PO TABS
10.0000 mg | ORAL_TABLET | ORAL | 0 refills | Status: DC
  Filled 2022-08-21 – 2022-08-29 (×2): qty 2, 1d supply, fill #0

## 2022-08-21 MED ORDER — CEPHALEXIN 500 MG PO CAPS
500.0000 mg | ORAL_CAPSULE | Freq: Two times a day (BID) | ORAL | 0 refills | Status: DC
  Filled 2022-08-21 – 2022-08-29 (×2): qty 2, 1d supply, fill #0

## 2022-08-22 ENCOUNTER — Other Ambulatory Visit: Payer: Self-pay

## 2022-08-29 ENCOUNTER — Other Ambulatory Visit (HOSPITAL_COMMUNITY): Payer: Self-pay

## 2022-09-06 ENCOUNTER — Other Ambulatory Visit (HOSPITAL_COMMUNITY): Payer: Self-pay

## 2022-10-24 ENCOUNTER — Other Ambulatory Visit: Payer: Self-pay | Admitting: Internal Medicine

## 2022-10-25 ENCOUNTER — Other Ambulatory Visit: Payer: Self-pay

## 2022-10-25 ENCOUNTER — Other Ambulatory Visit (HOSPITAL_COMMUNITY): Payer: Self-pay

## 2022-10-25 MED ORDER — LORAZEPAM 0.5 MG PO TABS
0.5000 mg | ORAL_TABLET | Freq: Two times a day (BID) | ORAL | 1 refills | Status: DC | PRN
  Filled 2022-10-25: qty 60, 15d supply, fill #0
  Filled 2022-11-22: qty 60, 15d supply, fill #1

## 2022-10-26 ENCOUNTER — Other Ambulatory Visit (HOSPITAL_COMMUNITY): Payer: Self-pay

## 2022-11-15 ENCOUNTER — Ambulatory Visit: Payer: Managed Care, Other (non HMO) | Admitting: Internal Medicine

## 2022-11-16 ENCOUNTER — Ambulatory Visit: Payer: Managed Care, Other (non HMO) | Admitting: Internal Medicine

## 2022-11-19 ENCOUNTER — Other Ambulatory Visit (HOSPITAL_COMMUNITY): Payer: Self-pay

## 2022-11-22 ENCOUNTER — Other Ambulatory Visit (HOSPITAL_COMMUNITY): Payer: Self-pay

## 2023-01-02 ENCOUNTER — Other Ambulatory Visit: Payer: Self-pay | Admitting: Internal Medicine

## 2023-01-02 MED ORDER — LORAZEPAM 0.5 MG PO TABS
0.5000 mg | ORAL_TABLET | Freq: Two times a day (BID) | ORAL | 0 refills | Status: DC | PRN
  Filled 2023-01-02: qty 60, 15d supply, fill #0

## 2023-01-03 ENCOUNTER — Other Ambulatory Visit (HOSPITAL_COMMUNITY): Payer: Self-pay

## 2023-01-08 ENCOUNTER — Other Ambulatory Visit (HOSPITAL_COMMUNITY): Payer: Self-pay

## 2023-02-14 ENCOUNTER — Other Ambulatory Visit: Payer: Self-pay | Admitting: Internal Medicine

## 2023-02-18 MED ORDER — TADALAFIL 20 MG PO TABS
20.0000 mg | ORAL_TABLET | Freq: Every day | ORAL | 0 refills | Status: DC | PRN
  Filled 2023-02-18: qty 30, 30d supply, fill #0

## 2023-02-19 ENCOUNTER — Other Ambulatory Visit: Payer: Self-pay

## 2023-02-19 ENCOUNTER — Other Ambulatory Visit (HOSPITAL_COMMUNITY): Payer: Self-pay

## 2023-04-05 ENCOUNTER — Other Ambulatory Visit: Payer: Self-pay | Admitting: Internal Medicine

## 2023-04-08 ENCOUNTER — Other Ambulatory Visit (HOSPITAL_COMMUNITY): Payer: Self-pay

## 2023-04-09 ENCOUNTER — Other Ambulatory Visit (HOSPITAL_COMMUNITY): Payer: Self-pay

## 2023-04-09 MED ORDER — TADALAFIL 20 MG PO TABS
20.0000 mg | ORAL_TABLET | Freq: Every day | ORAL | 0 refills | Status: DC | PRN
  Filled 2023-04-09: qty 30, 30d supply, fill #0

## 2023-04-09 MED ORDER — LORAZEPAM 0.5 MG PO TABS
0.5000 mg | ORAL_TABLET | Freq: Two times a day (BID) | ORAL | 0 refills | Status: DC | PRN
  Filled 2023-04-09: qty 60, 15d supply, fill #0

## 2023-04-11 ENCOUNTER — Other Ambulatory Visit (HOSPITAL_COMMUNITY): Payer: Self-pay

## 2023-04-11 MED ORDER — OFLOXACIN 0.3 % OT SOLN
OTIC | 0 refills | Status: AC
  Filled 2023-04-11: qty 5, 5d supply, fill #0

## 2023-04-22 ENCOUNTER — Other Ambulatory Visit (HOSPITAL_COMMUNITY): Payer: Self-pay

## 2023-04-22 ENCOUNTER — Encounter: Payer: Self-pay | Admitting: Internal Medicine

## 2023-04-22 ENCOUNTER — Ambulatory Visit (INDEPENDENT_AMBULATORY_CARE_PROVIDER_SITE_OTHER): Payer: Managed Care, Other (non HMO) | Admitting: Internal Medicine

## 2023-04-22 VITALS — BP 130/82 | HR 63 | Temp 98.2°F | Ht 74.0 in | Wt 219.0 lb

## 2023-04-22 DIAGNOSIS — Z0000 Encounter for general adult medical examination without abnormal findings: Secondary | ICD-10-CM

## 2023-04-22 DIAGNOSIS — H6121 Impacted cerumen, right ear: Secondary | ICD-10-CM

## 2023-04-22 DIAGNOSIS — F419 Anxiety disorder, unspecified: Secondary | ICD-10-CM | POA: Diagnosis not present

## 2023-04-22 DIAGNOSIS — D485 Neoplasm of uncertain behavior of skin: Secondary | ICD-10-CM | POA: Diagnosis not present

## 2023-04-22 DIAGNOSIS — L659 Nonscarring hair loss, unspecified: Secondary | ICD-10-CM | POA: Diagnosis not present

## 2023-04-22 DIAGNOSIS — H6120 Impacted cerumen, unspecified ear: Secondary | ICD-10-CM | POA: Insufficient documentation

## 2023-04-22 MED ORDER — FINASTERIDE 5 MG PO TABS
ORAL_TABLET | Freq: Every day | ORAL | 3 refills | Status: AC
  Filled 2023-04-22: qty 30, 120d supply, fill #0

## 2023-04-22 NOTE — Assessment & Plan Note (Signed)
 R ear wax and ?external injury w/a Q tip ENT appt in 2 d

## 2023-04-22 NOTE — Patient Instructions (Signed)
 PROPECIA 1 mg tab

## 2023-04-22 NOTE — Assessment & Plan Note (Signed)
 Proscar 1/4 tab a day

## 2023-04-22 NOTE — Assessment & Plan Note (Signed)
 Neck - B sides Skin bx adviced
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
## 2023-04-22 NOTE — Assessment & Plan Note (Signed)
 Lorazepam prn rare use  Potential benefits of a long term benzodiazepines  use as well as potential risks  and complications were explained to the patient and were aknowledged.

## 2023-04-24 ENCOUNTER — Other Ambulatory Visit (HOSPITAL_COMMUNITY): Payer: Self-pay

## 2023-05-14 ENCOUNTER — Other Ambulatory Visit: Payer: Self-pay | Admitting: Internal Medicine

## 2023-05-14 MED ORDER — TADALAFIL 20 MG PO TABS
20.0000 mg | ORAL_TABLET | Freq: Every day | ORAL | 1 refills | Status: DC | PRN
  Filled 2023-05-14: qty 30, 30d supply, fill #0
  Filled 2023-06-23 – 2023-07-12 (×3): qty 30, 30d supply, fill #1

## 2023-05-15 ENCOUNTER — Other Ambulatory Visit (HOSPITAL_COMMUNITY): Payer: Self-pay

## 2023-05-17 ENCOUNTER — Ambulatory Visit: Payer: Managed Care, Other (non HMO) | Admitting: Internal Medicine

## 2023-06-07 ENCOUNTER — Other Ambulatory Visit (HOSPITAL_COMMUNITY)
Admission: RE | Admit: 2023-06-07 | Discharge: 2023-06-07 | Disposition: A | Discharge status: Home / Self Care | Source: Ambulatory Visit | Attending: Internal Medicine | Admitting: Internal Medicine

## 2023-06-07 ENCOUNTER — Ambulatory Visit: Payer: Managed Care, Other (non HMO) | Admitting: Internal Medicine

## 2023-06-07 ENCOUNTER — Encounter: Payer: Self-pay | Admitting: Internal Medicine

## 2023-06-07 VITALS — Ht 74.0 in

## 2023-06-07 DIAGNOSIS — D485 Neoplasm of uncertain behavior of skin: Secondary | ICD-10-CM

## 2023-06-07 DIAGNOSIS — Z1283 Encounter for screening for malignant neoplasm of skin: Secondary | ICD-10-CM

## 2023-06-07 DIAGNOSIS — Z9889 Other specified postprocedural states: Secondary | ICD-10-CM | POA: Insufficient documentation

## 2023-06-07 DIAGNOSIS — D224 Melanocytic nevi of scalp and neck: Secondary | ICD-10-CM | POA: Diagnosis not present

## 2023-06-07 DIAGNOSIS — D225 Melanocytic nevi of trunk: Secondary | ICD-10-CM

## 2023-06-07 NOTE — Patient Instructions (Signed)
Postprocedure instructions :    A Band-Aid should be  changed once or twice daily. You can take a shower tomorrow.  Keep the wounds clean. You can wash them with liquid soap and water. Pat dry with gauze or a Kleenex tissue before applying antibiotic ointment and a Band-Aid.   You need to report immediately  if fever, chills or any signs of infection develop.    The biopsy results should be available in 1 -2 weeks.   
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
## 2023-06-11 LAB — SURGICAL PATHOLOGY

## 2023-06-13 ENCOUNTER — Encounter: Payer: Self-pay | Admitting: Internal Medicine

## 2023-06-18 ENCOUNTER — Encounter: Payer: Self-pay | Admitting: Internal Medicine

## 2023-06-18 NOTE — Assessment & Plan Note (Signed)
 See procedure

## 2023-06-23 ENCOUNTER — Other Ambulatory Visit: Payer: Self-pay | Admitting: Internal Medicine

## 2023-06-24 MED ORDER — LORAZEPAM 0.5 MG PO TABS
0.5000 mg | ORAL_TABLET | Freq: Two times a day (BID) | ORAL | 0 refills | Status: DC | PRN
Start: 2023-06-24 — End: 2023-09-12
  Filled 2023-06-24: qty 60, 15d supply, fill #0

## 2023-06-25 ENCOUNTER — Other Ambulatory Visit: Payer: Self-pay

## 2023-06-25 ENCOUNTER — Other Ambulatory Visit (HOSPITAL_COMMUNITY): Payer: Self-pay

## 2023-07-12 ENCOUNTER — Other Ambulatory Visit (HOSPITAL_COMMUNITY): Payer: Self-pay

## 2023-08-06 ENCOUNTER — Other Ambulatory Visit (HOSPITAL_COMMUNITY): Payer: Self-pay

## 2023-08-06 MED ORDER — OXYCODONE-ACETAMINOPHEN 5-325 MG PO TABS
1.0000 | ORAL_TABLET | ORAL | 0 refills | Status: AC | PRN
  Filled 2023-08-06: qty 6, 1d supply, fill #0

## 2023-08-06 MED ORDER — CEPHALEXIN 500 MG PO CAPS
500.0000 mg | ORAL_CAPSULE | Freq: Two times a day (BID) | ORAL | 0 refills | Status: AC
  Filled 2023-08-06: qty 2, 1d supply, fill #0

## 2023-08-06 MED ORDER — DIAZEPAM 5 MG PO TABS
10.0000 mg | ORAL_TABLET | Freq: Every day | ORAL | 0 refills | Status: AC
  Filled 2023-08-06: qty 2, 1d supply, fill #0

## 2023-09-11 ENCOUNTER — Other Ambulatory Visit: Payer: Self-pay | Admitting: Internal Medicine

## 2023-09-12 ENCOUNTER — Encounter: Payer: Self-pay | Admitting: Internal Medicine

## 2023-09-12 ENCOUNTER — Other Ambulatory Visit: Payer: Self-pay | Admitting: Internal Medicine

## 2023-09-13 ENCOUNTER — Other Ambulatory Visit (HOSPITAL_COMMUNITY): Payer: Self-pay

## 2023-09-13 MED ORDER — TADALAFIL 20 MG PO TABS
20.0000 mg | ORAL_TABLET | Freq: Every day | ORAL | 1 refills | Status: DC | PRN
  Filled 2023-09-13: qty 30, 30d supply, fill #0
  Filled 2023-11-19: qty 30, 30d supply, fill #1

## 2023-09-15 MED ORDER — LORAZEPAM 0.5 MG PO TABS
0.5000 mg | ORAL_TABLET | Freq: Two times a day (BID) | ORAL | 0 refills | Status: DC | PRN
  Filled 2023-09-15: qty 60, 15d supply, fill #0

## 2023-09-16 ENCOUNTER — Other Ambulatory Visit (HOSPITAL_COMMUNITY): Payer: Self-pay

## 2023-09-16 ENCOUNTER — Other Ambulatory Visit: Payer: Self-pay

## 2023-10-21 ENCOUNTER — Ambulatory Visit: Payer: Managed Care, Other (non HMO) | Admitting: Internal Medicine

## 2023-11-19 ENCOUNTER — Other Ambulatory Visit: Payer: Self-pay | Admitting: Internal Medicine

## 2023-11-20 ENCOUNTER — Other Ambulatory Visit (HOSPITAL_COMMUNITY): Payer: Self-pay

## 2023-11-20 ENCOUNTER — Other Ambulatory Visit: Payer: Self-pay

## 2023-11-20 MED ORDER — LORAZEPAM 0.5 MG PO TABS
0.5000 mg | ORAL_TABLET | Freq: Two times a day (BID) | ORAL | 1 refills | Status: DC | PRN
  Filled 2023-11-20 (×2): qty 60, 15d supply, fill #0
  Filled 2024-01-26: qty 60, 15d supply, fill #1

## 2023-12-05 ENCOUNTER — Other Ambulatory Visit (HOSPITAL_COMMUNITY): Payer: Self-pay

## 2023-12-05 MED ORDER — DOXYCYCLINE HYCLATE 100 MG PO TABS
100.0000 mg | ORAL_TABLET | Freq: Two times a day (BID) | ORAL | 0 refills | Status: AC
  Filled 2023-12-05: qty 20, 10d supply, fill #0

## 2023-12-06 ENCOUNTER — Other Ambulatory Visit (HOSPITAL_COMMUNITY): Payer: Self-pay

## 2023-12-15 ENCOUNTER — Other Ambulatory Visit (HOSPITAL_COMMUNITY): Payer: Self-pay

## 2024-01-26 ENCOUNTER — Other Ambulatory Visit: Payer: Self-pay | Admitting: Internal Medicine

## 2024-01-27 ENCOUNTER — Other Ambulatory Visit: Payer: Self-pay

## 2024-01-27 ENCOUNTER — Other Ambulatory Visit (HOSPITAL_COMMUNITY): Payer: Self-pay

## 2024-01-27 MED ORDER — TADALAFIL 20 MG PO TABS
20.0000 mg | ORAL_TABLET | Freq: Every day | ORAL | 1 refills | Status: AC | PRN
  Filled 2024-01-27: qty 30, 30d supply, fill #0
  Filled 2024-04-29: qty 30, 30d supply, fill #1

## 2024-04-29 ENCOUNTER — Other Ambulatory Visit (HOSPITAL_COMMUNITY): Payer: Self-pay

## 2024-04-29 ENCOUNTER — Other Ambulatory Visit: Payer: Self-pay | Admitting: Internal Medicine

## 2024-04-29 MED ORDER — LORAZEPAM 0.5 MG PO TABS
0.5000 mg | ORAL_TABLET | Freq: Two times a day (BID) | ORAL | 1 refills | Status: AC | PRN
  Filled 2024-04-29: qty 60, 15d supply, fill #0

## 2024-04-30 ENCOUNTER — Other Ambulatory Visit (HOSPITAL_COMMUNITY): Payer: Self-pay

## 2024-05-12 ENCOUNTER — Other Ambulatory Visit (HOSPITAL_COMMUNITY): Payer: Self-pay

## 2024-05-12 MED ORDER — OSELTAMIVIR PHOSPHATE 75 MG PO CAPS
75.0000 mg | ORAL_CAPSULE | Freq: Two times a day (BID) | ORAL | 0 refills | Status: AC
  Filled 2024-05-12: qty 10, 5d supply, fill #0
